# Patient Record
Sex: Male | Born: 1990
Health system: Southern US, Community
[De-identification: ages and names within clinical notes are randomized; demographics above are authoritative.]

## PROBLEM LIST (undated history)

## (undated) DIAGNOSIS — F419 Anxiety disorder, unspecified: Secondary | ICD-10-CM

## (undated) DIAGNOSIS — G43909 Migraine, unspecified, not intractable, without status migrainosus: Secondary | ICD-10-CM

## (undated) DIAGNOSIS — I10 Essential (primary) hypertension: Secondary | ICD-10-CM

## (undated) DIAGNOSIS — F329 Major depressive disorder, single episode, unspecified: Secondary | ICD-10-CM

## (undated) DIAGNOSIS — F32A Depression, unspecified: Secondary | ICD-10-CM

## (undated) HISTORY — DX: Major depressive disorder, single episode, unspecified: F32.9

## (undated) HISTORY — DX: Depression, unspecified: F32.A

## (undated) HISTORY — PX: APPENDECTOMY: SHX54

## (undated) HISTORY — DX: Anxiety disorder, unspecified: F41.9

---

## 2013-02-18 ENCOUNTER — Emergency Department (HOSPITAL_COMMUNITY)
Admission: EM | Admit: 2013-02-18 | Discharge: 2013-02-18 | Disposition: A | Discharge status: Home / Self Care | Payer: BC Managed Care – PPO | Attending: Emergency Medicine | Admitting: Emergency Medicine

## 2013-02-18 ENCOUNTER — Encounter (HOSPITAL_COMMUNITY): Payer: Self-pay | Admitting: *Deleted

## 2013-02-18 DIAGNOSIS — R109 Unspecified abdominal pain: Secondary | ICD-10-CM | POA: Insufficient documentation

## 2013-02-18 DIAGNOSIS — M545 Low back pain, unspecified: Secondary | ICD-10-CM | POA: Insufficient documentation

## 2013-02-18 DIAGNOSIS — Z8679 Personal history of other diseases of the circulatory system: Secondary | ICD-10-CM | POA: Insufficient documentation

## 2013-02-18 HISTORY — DX: Migraine, unspecified, not intractable, without status migrainosus: G43.909

## 2013-02-18 LAB — CBC WITH DIFFERENTIAL/PLATELET
Basophils Absolute: 0 10*3/uL (ref 0.0–0.1)
Basophils Relative: 0 % (ref 0–1)
Eosinophils Absolute: 0.1 10*3/uL (ref 0.0–0.7)
Eosinophils Relative: 1 % (ref 0–5)
HCT: 41.9 % (ref 39.0–52.0)
Hemoglobin: 15.1 g/dL (ref 13.0–17.0)
Lymphocytes Relative: 36 % (ref 12–46)
Lymphs Abs: 1.4 10*3/uL (ref 0.7–4.0)
MCH: 28.3 pg (ref 26.0–34.0)
MCHC: 36 g/dL (ref 30.0–36.0)
MCV: 78.5 fL (ref 78.0–100.0)
Monocytes Absolute: 0.3 10*3/uL (ref 0.1–1.0)
Monocytes Relative: 9 % (ref 3–12)
Neutro Abs: 2 10*3/uL (ref 1.7–7.7)
Neutrophils Relative %: 54 % (ref 43–77)
Platelets: 226 10*3/uL (ref 150–400)
RBC: 5.34 MIL/uL (ref 4.22–5.81)
RDW: 14.2 % (ref 11.5–15.5)
WBC: 3.8 10*3/uL — ABNORMAL LOW (ref 4.0–10.5)

## 2013-02-18 LAB — BASIC METABOLIC PANEL
BUN: 15 mg/dL (ref 6–23)
CO2: 25 mEq/L (ref 19–32)
Calcium: 9.6 mg/dL (ref 8.4–10.5)
Chloride: 104 mEq/L (ref 96–112)
Creatinine, Ser: 1.29 mg/dL (ref 0.50–1.35)
GFR calc Af Amer: 89 mL/min — ABNORMAL LOW (ref >90)
GFR calc non Af Amer: 76 mL/min — ABNORMAL LOW (ref >90)
Glucose, Bld: 99 mg/dL (ref 70–99)
Potassium: 4.1 mEq/L (ref 3.5–5.1)
Sodium: 137 mEq/L (ref 135–145)

## 2013-02-18 LAB — URINALYSIS, ROUTINE W REFLEX MICROSCOPIC
Bilirubin Urine: NEGATIVE
Glucose, UA: NEGATIVE mg/dL
Hgb urine dipstick: NEGATIVE
Ketones, ur: NEGATIVE mg/dL
Leukocytes, UA: NEGATIVE
Nitrite: NEGATIVE
Protein, ur: NEGATIVE mg/dL
Specific Gravity, Urine: 1.023 (ref 1.005–1.030)
Urobilinogen, UA: 0.2 mg/dL (ref 0.0–1.0)
pH: 6.5 (ref 5.0–8.0)

## 2013-02-18 MED ORDER — PROMETHAZINE HCL 25 MG PO TABS: 25.0000 mg | ORAL_TABLET | Freq: Four times a day (QID) | ORAL | Status: DC | PRN

## 2013-02-18 MED ORDER — OXYCODONE-ACETAMINOPHEN 5-325 MG PO TABS: 1.0000 | ORAL_TABLET | ORAL | Status: DC | PRN

## 2013-02-18 NOTE — ED Notes (Signed)
Patient is alert and orientedx4.  Patient was explained discharge instructions and they understood them with no questions.   

## 2013-02-18 NOTE — ED Notes (Signed)
Patient said his back pain started last Thursday after moving some equipment.  The patient said his right side from his head to his hip feels abnormal, totally separate from his left side. The patient said he has had a migraine headache for two months.  His right side has been clammy, but his throat feels dry.  The patient said he came in today because his pain got worse.  He has been taking ibuprofen and naproxen at home.

## 2013-02-18 NOTE — ED Provider Notes (Signed)
History     CSN: [id number]  Arrival date & time 02/18/13  0818   First MD Initiated Contact with Patient 02/18/13 [phone]      Chief Complaint  Patient presents with  . Back Pain  . Flank Pain    (Consider location/radiation/quality/duration/timing/severity/associated sxs/prior treatment) HPI... sharp lower back pain for one week.  No dysuria,hematuria, frequency, fever, chills, chest pain, dyspnea. Nothing makes symptoms better or worse. Severity is mild to moderate.patient is normally healthy  Past Medical History  Diagnosis Date  . Migraine     History reviewed. No pertinent past surgical history.  History reviewed. No pertinent family history.  History  Substance Use Topics  . Smoking status: Not on file  . Smokeless tobacco: Not on file  . Alcohol Use: Not on file     Comment: occ      Review of Systems  All other systems reviewed and are negative.    Allergies  Review of patient's allergies indicates no known allergies.  Home Medications   Current Outpatient Rx  Name  Route  Sig  Dispense  Refill  . ibuprofen (ADVIL,MOTRIN) 600 MG tablet   Oral   Take 600 mg by mouth every 6 (six) hours as needed for pain.         . naproxen sodium (ANAPROX) 220 MG tablet   Oral   Take 440 mg by mouth 2 (two) times daily as needed (for pain).         Marland Kitchen oxyCODONE-acetaminophen (PERCOCET) 5-325 MG per tablet   Oral   Take 1 tablet by mouth every 4 (four) hours as needed for pain.   20 tablet   0   . promethazine (PHENERGAN) 25 MG tablet   Oral   Take 1 tablet (25 mg total) by mouth every 6 (six) hours as needed for nausea.   15 tablet   0     BP 139/60  Pulse 77  Temp(Src) 98.1 F (36.7 C) (Oral)  Resp 18  SpO2 100%  Physical Exam  Nursing note and vitals reviewed. Constitutional: He is oriented to person, place, and time. He appears well-developed and well-nourished.  HENT:  Head: Normocephalic and atraumatic.  Eyes: Conjunctivae and EOM are  normal. Pupils are equal, round, and reactive to light.  Neck: Normal range of motion. Neck supple.  Cardiovascular: Normal rate, regular rhythm and normal heart sounds.   Pulmonary/Chest: Effort normal and breath sounds normal.  Abdominal: Soft. Bowel sounds are normal.  Genitourinary:  Minimal right flank tenderness  Musculoskeletal: Normal range of motion.  Neurological: He is alert and oriented to person, place, and time.  Skin: Skin is warm and dry.  Psychiatric: He has a normal mood and affect.    ED Course  Procedures (including critical care time)  Labs Reviewed  BASIC METABOLIC PANEL - Abnormal; Notable for the following:    GFR calc non Af Amer 76 (*)    GFR calc Af Amer 89 (*)    All other components within normal limits  CBC WITH DIFFERENTIAL - Abnormal; Notable for the following:    WBC 3.8 (*)    All other components within normal limits  URINALYSIS, ROUTINE W REFLEX MICROSCOPIC   No results found.   1. Right flank pain       MDM  Patient has normal physical exam including normal vital signs.  Screening labs and urinalysis normal.  Discharge meds Percocet #20 and Phenergan 25 mg #15  Donnetta Hutching, MD 02/18/13 1316

## 2013-02-18 NOTE — ED Notes (Signed)
Pt reports lower back pain x 1 week. Started having right side flank pain/rib pain last night. No acute distress noted at triage.

## 2015-02-15 ENCOUNTER — Ambulatory Visit
Admission: RE | Admit: 2015-02-15 | Discharge: 2015-02-15 | Disposition: A | Discharge status: Home / Self Care | Payer: Medicaid Other | Source: Ambulatory Visit | Attending: Family Medicine | Admitting: Family Medicine

## 2015-02-15 ENCOUNTER — Other Ambulatory Visit: Payer: Self-pay | Admitting: Family Medicine

## 2015-02-15 DIAGNOSIS — R52 Pain, unspecified: Secondary | ICD-10-CM

## 2015-11-14 ENCOUNTER — Telehealth: Payer: Self-pay | Admitting: Internal Medicine

## 2015-11-14 NOTE — Telephone Encounter (Signed)
NO ANSWER/ LMTCB IF HE NEEDS TO CANCEL APPT.

## 2015-11-14 NOTE — Telephone Encounter (Signed)
CALLED BACK AND CONFIRMED APPT FOR 11/15/15

## 2015-11-15 ENCOUNTER — Ambulatory Visit: Payer: Medicaid Other | Admitting: Internal Medicine

## 2015-11-17 ENCOUNTER — Encounter: Payer: Self-pay | Admitting: Internal Medicine

## 2015-11-17 ENCOUNTER — Ambulatory Visit (HOSPITAL_COMMUNITY)
Admission: RE | Admit: 2015-11-17 | Discharge: 2015-11-17 | Disposition: A | Discharge status: Home / Self Care | Payer: Medicaid Other | Source: Ambulatory Visit | Attending: Oncology | Admitting: Oncology

## 2015-11-17 ENCOUNTER — Ambulatory Visit (INDEPENDENT_AMBULATORY_CARE_PROVIDER_SITE_OTHER): Payer: Medicaid Other | Admitting: Internal Medicine

## 2015-11-17 VITALS — BP 104/61 | HR 61 | Temp 98.5°F | Ht 70.9 in | Wt 225.7 lb

## 2015-11-17 DIAGNOSIS — R51 Headache: Secondary | ICD-10-CM | POA: Diagnosis not present

## 2015-11-17 DIAGNOSIS — M25511 Pain in right shoulder: Secondary | ICD-10-CM | POA: Insufficient documentation

## 2015-11-17 DIAGNOSIS — Z23 Encounter for immunization: Secondary | ICD-10-CM

## 2015-11-17 DIAGNOSIS — F418 Other specified anxiety disorders: Secondary | ICD-10-CM

## 2015-11-17 DIAGNOSIS — G44229 Chronic tension-type headache, not intractable: Secondary | ICD-10-CM

## 2015-11-17 DIAGNOSIS — R519 Headache, unspecified: Secondary | ICD-10-CM | POA: Insufficient documentation

## 2015-11-17 DIAGNOSIS — F319 Bipolar disorder, unspecified: Secondary | ICD-10-CM | POA: Insufficient documentation

## 2015-11-17 DIAGNOSIS — Z0000 Encounter for general adult medical examination without abnormal findings: Secondary | ICD-10-CM | POA: Insufficient documentation

## 2015-11-17 MED ORDER — MELOXICAM 15 MG PO TABS: 15.0000 mg | ORAL_TABLET | Freq: Every day | ORAL | Status: DC

## 2015-11-17 MED ORDER — MELOXICAM 15 MG PO TABS: 7.5000 mg | ORAL_TABLET | Freq: Every day | ORAL | Status: DC

## 2015-11-17 NOTE — Progress Notes (Signed)
   Subjective:   Patient ID: Paul Jarvis male   DOB: January 04, 1991 25 y.o.   MRN: [id number]  HPI: Paul Jarvis is a 25 y.o. male w/ no significant PMHx, presents to the clinic today for a new patient visit for right sided headache. Patient states he has had chronic headaches for as long as he can remember. States the pain starts in his right temple and extends into his neck and down his right arm and feels like muscle cramping. He states the pain can last for days at a time and is typically dull in nature, 10/10 in severity. He has tried Tylenol but this does not seem to help. He feels that he sometimes has light and sound sensitivity, but denies nausea or vomiting. No positional changes. No fever or chills. He does feel that sometimes his vision is slightly blurry in his right eye during his acute episodes, but does not describe sensation of aura. No nuchal rigidity. No eye watering. The patient does describe issues with stress, anxiety, and depression, for which he is seeing a psychologist relatively regularly.   Patient also describes loud shoulder popping in his right shoulder with extension/abduction but denies significant pain.   No significant family history, former smoker, no alcohol or drug use. Married, wife accompanied patient to appointment today, have small child.    Past Medical History  Diagnosis Date  . Migraine   . Anxiety   . Depression    Current Outpatient Prescriptions  Medication Sig Dispense Refill  . meloxicam (MOBIC) 15 MG tablet Take 1 tablet (15 mg total) by mouth daily. 30 tablet 1   No current facility-administered medications for this visit.    Review of Systems: General: Denies fever, chills, diaphoresis, appetite change and fatigue.  Respiratory: Denies SOB, DOE, cough, and wheezing.   Cardiovascular: Denies chest pain and palpitations.  Gastrointestinal: Denies nausea, vomiting, abdominal pain, and diarrhea.  Genitourinary: Denies dysuria, increased  frequency, and flank pain. Endocrine: Denies hot or cold intolerance, polyuria, and polydipsia. Musculoskeletal: Denies myalgias, back pain, joint swelling, arthralgias and gait problem.  Skin: Denies pallor, rash and wounds.  Neurological: Positive for headaches. Denies dizziness, seizures, syncope, weakness, lightheadedness, numbness and headaches.  Psychiatric/Behavioral: Denies mood changes, and sleep disturbances.  Objective:   Physical Exam: Filed Vitals:   11/17/15 1540  BP: 104/61  Pulse: 61  Temp: 98.5 F (36.9 C)  TempSrc: Oral  Height: 5' 10.9" (1.801 m)  Weight: 225 lb 11.2 oz (102.377 kg)  SpO2: 100%    General: Alert, cooperative, NAD. HEENT: PERRL, EOMI. Moist mucus membranes Neck: Full range of motion without pain, supple, no lymphadenopathy or carotid bruits Lungs: Clear to ascultation bilaterally, normal work of respiration, no wheezes, rales, rhonchi Heart: RRR, no murmurs, gallops, or rubs Abdomen: Soft, non-tender, non-distended, BS + Extremities: No cyanosis, clubbing, or edema. No limitations in ROM in shoulder joint, no tenderness to palpation.  Neurologic: Alert & oriented X3, cranial nerves II-XII intact, strength grossly intact, sensation intact to light touch   Assessment & Plan:   Please see problem based assessment and plan.

## 2015-11-17 NOTE — Patient Instructions (Signed)
1. Make a follow up appointment for 4 weeks.   2. Please take all medications as previously prescribed with the following changes:  Take Mobic 15 mg once daily AS NEEDED for headache.   Go upstairs to radiology for right shoulder XR.  I will call you only if there are any abnormalities with labs and imaging.   3. If you have worsening of your symptoms or new symptoms arise, please call the clinic ([phone]), or go to the ER immediately if symptoms are severe.   Tension Headache A tension headache is a feeling of pain, pressure, or aching that is often felt over the front and sides of the head. The pain can be dull, or it can feel tight (constricting). Tension headaches are not normally associated with nausea or vomiting, and they do not get worse with physical activity. Tension headaches can last from 30 minutes to several days. This is the most common type of headache. CAUSES The exact cause of this condition is not known. Tension headaches often begin after stress, anxiety, or depression. Other triggers may include:  Alcohol.  Too much caffeine, or caffeine withdrawal.  Respiratory infections, such as colds, flu, or sinus infections.  Dental problems or teeth clenching.  Fatigue.  Holding your head and neck in the same position for a long period of time, such as while using a computer.  Smoking. SYMPTOMS Symptoms of this condition include:  A feeling of pressure around the head.  Dull, aching head pain.  Pain felt over the front and sides of the head.  Tenderness in the muscles of the head, neck, and shoulders. DIAGNOSIS This condition may be diagnosed based on your symptoms and a physical exam. Tests may be done, such as a CT scan or an MRI of your head. These tests may be done if your symptoms are severe or unusual. TREATMENT This condition may be treated with lifestyle changes and medicines to help relieve symptoms. HOME CARE INSTRUCTIONS Managing Pain  Take  over-the-counter and prescription medicines only as told by your health care provider.  Lie down in a dark, quiet room when you have a headache.  If directed, apply ice to the head and neck area:  Put ice in a plastic bag.  Place a towel between your skin and the bag.  Leave the ice on for 20 minutes, 2-3 times per day.  Use a heating pad or a hot shower to apply heat to the head and neck area as told by your health care provider. Eating and Drinking  Eat meals on a regular schedule.  Limit alcohol use.  Decrease your caffeine intake, or stop using caffeine. General Instructions  Keep all follow-up visits as told by your health care provider. This is important.  Keep a headache journal to help find out what may trigger your headaches. For example, write down:  What you eat and drink.  How much sleep you get.  Any change to your diet or medicines.  Try massage or other relaxation techniques.  Limit stress.  Sit up straight, and avoid tensing your muscles.  Do not use tobacco products, including cigarettes, chewing tobacco, or e-cigarettes. If you need help quitting, ask your health care provider.  Exercise regularly as told by your health care provider.  Get 7-9 hours of sleep, or the amount recommended by your health care provider. SEEK MEDICAL CARE IF:  Your symptoms are not helped by medicine.  You have a headache that is different from what you normally  experience.  You have nausea or you vomit.  You have a fever. SEEK IMMEDIATE MEDICAL CARE IF:  Your headache becomes severe.  You have repeated vomiting.  You have a stiff neck.  You have a loss of vision.  You have problems with speech.  You have pain in your eye or ear.  You have muscular weakness or loss of muscle control.  You lose your balance or you have trouble walking.  You feel faint or you pass out.  You have confusion.   This information is not intended to replace advice given to  you by your health care provider. Make sure you discuss any questions you have with your health care provider.   Document Released: 10/01/2005 Document Revised: 06/22/2015 Document Reviewed: 01/24/2015 Elsevier Interactive Patient Education [employer]

## 2015-11-18 ENCOUNTER — Encounter: Payer: Self-pay | Admitting: Internal Medicine

## 2015-11-18 LAB — CBC WITH DIFFERENTIAL/PLATELET
Basophils Absolute: 0 10*3/uL (ref 0.0–0.2)
Basos: 0 %
EOS (ABSOLUTE): 0.1 10*3/uL (ref 0.0–0.4)
Eos: 3 %
Hematocrit: 42.7 % (ref 37.5–51.0)
Hemoglobin: 14.8 g/dL (ref 12.6–17.7)
Immature Grans (Abs): 0 10*3/uL (ref 0.0–0.1)
Immature Granulocytes: 0 %
Lymphocytes Absolute: 2.3 10*3/uL (ref 0.7–3.1)
Lymphs: 46 %
MCH: 27.4 pg (ref 26.6–33.0)
MCHC: 34.7 g/dL (ref 31.5–35.7)
MCV: 79 fL (ref 79–97)
Monocytes Absolute: 0.4 10*3/uL (ref 0.1–0.9)
Monocytes: 8 %
Neutrophils Absolute: 2.2 10*3/uL (ref 1.4–7.0)
Neutrophils: 43 %
Platelets: 269 10*3/uL (ref 150–379)
RBC: 5.4 x10E6/uL (ref 4.14–5.80)
RDW: 14.3 % (ref 12.3–15.4)
WBC: 5.1 10*3/uL (ref 3.4–10.8)

## 2015-11-18 LAB — CMP14 + ANION GAP
ALT: 18 IU/L (ref 0–44)
AST: 16 IU/L (ref 0–40)
Albumin/Globulin Ratio: 1.8 (ref 1.1–2.5)
Albumin: 4.5 g/dL (ref 3.5–5.5)
Alkaline Phosphatase: 58 IU/L (ref 39–117)
Anion Gap: 18 mmol/L (ref 10.0–18.0)
BUN/Creatinine Ratio: 11 (ref 8–19)
BUN: 14 mg/dL (ref 6–20)
Bilirubin Total: 0.3 mg/dL (ref 0.0–1.2)
CO2: 20 mmol/L (ref 18–29)
Calcium: 9.4 mg/dL (ref 8.7–10.2)
Chloride: 102 mmol/L (ref 96–106)
Creatinine, Ser: 1.25 mg/dL (ref 0.76–1.27)
GFR calc Af Amer: 91 mL/min/{1.73_m2} (ref >59)
GFR calc non Af Amer: 79 mL/min/{1.73_m2} (ref >59)
Globulin, Total: 2.5 g/dL (ref 1.5–4.5)
Glucose: 97 mg/dL (ref 65–99)
Potassium: 4.1 mmol/L (ref 3.5–5.2)
Sodium: 140 mmol/L (ref 134–144)
Total Protein: 7 g/dL (ref 6.0–8.5)

## 2015-11-18 LAB — HIV ANTIBODY (ROUTINE TESTING W REFLEX): HIV Screen 4th Generation wRfx: NONREACTIVE

## 2015-11-18 NOTE — Assessment & Plan Note (Signed)
Checked basic labs today, no significant abnormalities. HIV negative. Given Tdap.

## 2015-11-18 NOTE — Assessment & Plan Note (Signed)
Has some very mild right shoulder pain, but mostly he has loud shoulder popping with abduction and rotation. No apparent trauma or injury. XR of the shoulder performed, showed no significant bony abnormalities. If this starts to cause him more significant discomfort, can consider referral to sports medicine and physical therapy.

## 2015-11-18 NOTE — Assessment & Plan Note (Addendum)
Patient describes a dull, right sided headache that extends into his neck and shoulder. Has issues with stress and anxiety, as well as mild depression. Sees psychologist for this. Has taken Tylenol for his headache but with no relief. More than anything, he seems to describe symptoms of a tension headache, but he seems to have some features of both migraine and cluster headaches as well. Does not have nausea, no aura, minimal light or sound sensitivity. Headache is unilateral and sometimes has mild decreased vision in his right eye during an episode, but does not have eye watering, redness, or sharp stabbing pain behind the eye. Has apparently been told he has a migraine in the past.  -Will start with NSAIDS; interested in trying Mobic 15 mg daily prn given that this is a once daily medication (does not like to take a lot of medications). Has tried some OTC NSAIDS with limited relief.  -If no benefit with this, can switch to different NSAID.  -Discussed controlling anxiety and stress. -RTC in 4 weeks

## 2015-11-18 NOTE — Assessment & Plan Note (Signed)
Describes features of mild depression with anxiety, sees psychologist regularly for this. Is not interested in trying medications. Does not have suicidal ideations.

## 2015-11-18 NOTE — Progress Notes (Signed)
[facility] Medicine Clinic Attending  Case discussed with Dr. Jones at the time of the visit.  We reviewed the resident's history and exam and pertinent patient test results.  I agree with the assessment, diagnosis, and plan of care documented in the resident's note.  

## 2015-12-20 ENCOUNTER — Ambulatory Visit (INDEPENDENT_AMBULATORY_CARE_PROVIDER_SITE_OTHER): Payer: Medicaid Other | Admitting: Internal Medicine

## 2015-12-20 ENCOUNTER — Encounter: Payer: Self-pay | Admitting: Internal Medicine

## 2015-12-20 VITALS — BP 138/58 | HR 66 | Temp 97.9°F | Ht 69.9 in | Wt 232.3 lb

## 2015-12-20 DIAGNOSIS — G44219 Episodic tension-type headache, not intractable: Secondary | ICD-10-CM

## 2015-12-20 DIAGNOSIS — R51 Headache: Secondary | ICD-10-CM | POA: Diagnosis not present

## 2015-12-20 DIAGNOSIS — M25511 Pain in right shoulder: Secondary | ICD-10-CM

## 2015-12-20 NOTE — Progress Notes (Signed)
   Subjective:   Patient ID: Paul Jarvis Paul Jarvis male   DOB: Jun 23, 1991 25 y.o.   MRN: [id number]  HPI: [name] is a 25 y.o. male w/ no significant PMHx, presents to the clinic today for a follow up visit for shoulder pain and headaches. Says the headaches have improved with the use of NSAIDS. Is complaining of continued right shoulder discomfort. XR at last visit with no acute abnormalities. 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 No difficulty with ROM, but says it aches on the top of his shoulder.    Current Outpatient Prescriptions  Medication Sig Dispense Refill  . meloxicam (MOBIC) 15 MG tablet Take 1 tablet (15 mg total) by mouth daily. 30 tablet 1   No current facility-administered medications for this visit.    Review of Systems  General: Denies fever, diaphoresis, appetite change, and fatigue.  Respiratory: Denies SOB, cough, and wheezing.   Cardiovascular: Denies chest pain and palpitations.  Gastrointestinal: Denies nausea, vomiting, abdominal pain, and diarrhea Musculoskeletal: Positive for right shoulder pain. Denies myalgias, back pain, and gait problem.  Neurological: Denies dizziness, syncope, weakness, lightheadedness, and headaches.  Psychiatric/Behavioral: Denies mood changes, sleep disturbance, and agitation.   Objective:   Physical Exam: Filed Vitals:   12/20/15 1602  BP: 138/58  Pulse: 66  Temp: 97.9 F (36.6 C)  TempSrc: Oral  Height: 5' 9.9" (1.775 m)  Weight: 232 lb 4.8 oz (105.371 kg)  SpO2: 100%    General: Alert, cooperative, NAD. HEENT: PERRL, EOMI. Moist mucus membranes Neck: Full range of motion without pain, supple, no lymphadenopathy or carotid bruits Lungs: Clear to ascultation bilaterally, normal work of respiration, no wheezes, rales, rhonchi Heart: RRR, no murmurs, gallops, or rubs Abdomen: Soft, non-tender, non-distended, BS + Extremities: No cyanosis, clubbing, or edema. No limitations in ROM in right  shoulder joint. Mild tenderness in the location of the subacromial space. No increased laxity. Neurologic: Alert & oriented X3, cranial nerves II-XII intact, strength grossly intact, sensation intact to light touch   Assessment & Plan:   Please see problem based assessment and plan.

## 2015-12-20 NOTE — Patient Instructions (Signed)
1. Please make a follow up appointment in 6 months.   2. Someone from PT and Sports Medicine will contact you.   3. If you have worsening of your symptoms or new symptoms arise, please call the clinic [phone]), or go to the ER immediately if symptoms are severe.  Shoulder Pain The shoulder is the joint that connects your arms to your body. The bones that form the shoulder joint include the upper arm bone (humerus), the shoulder blade (scapula), and the collarbone (clavicle). The top of the humerus is shaped like a ball and fits into a rather flat socket on the scapula (glenoid cavity). A combination of muscles and strong, fibrous tissues that connect muscles to bones (tendons) support your shoulder joint and hold the ball in the socket. Small, fluid-filled sacs (bursae) are located in different areas of the joint. They act as cushions between the bones and the overlying soft tissues and help reduce friction between the gliding tendons and the bone as you move your arm. Your shoulder joint allows a wide range of motion in your arm. This range of motion allows you to do things like scratch your back or throw a ball. However, this range of motion also makes your shoulder more prone to pain from overuse and injury. Causes of shoulder pain can originate from both injury and overuse and usually can be grouped in the following four categories:  Redness, swelling, and pain (inflammation) of the tendon (tendinitis) or the bursae (bursitis).  Instability, such as a dislocation of the joint.  Inflammation of the joint (arthritis).  Broken bone (fracture). HOME CARE INSTRUCTIONS   Apply ice to the sore area.  Put ice in a plastic bag.  Place a towel between your skin and the bag.  Leave the ice on for 15-20 minutes, 3-4 times per day for the first 2 days, or as directed by your health care provider.  Stop using cold packs if they do not help with the pain.  If you have a shoulder sling or  immobilizer, wear it as long as your caregiver instructs. Only remove it to shower or bathe. Move your arm as little as possible, but keep your hand moving to prevent swelling.  Squeeze a soft ball or foam pad as much as possible to help prevent swelling.  Only take over-the-counter or prescription medicines for pain, discomfort, or fever as directed by your caregiver. SEEK MEDICAL CARE IF:   Your shoulder pain increases, or new pain develops in your arm, hand, or fingers.  Your hand or fingers become cold and numb.  Your pain is not relieved with medicines. SEEK IMMEDIATE MEDICAL CARE IF:   Your arm, hand, or fingers are numb or tingling.  Your arm, hand, or fingers are significantly swollen or turn white or blue. MAKE SURE YOU:   Understand these instructions.  Will watch your condition.  Will get help right away if you are not doing well or get worse.   This information is not intended to replace advice given to you by your health care provider. Make sure you discuss any questions you have with your health care provider.   Document Released: 07/11/2005 Document Revised: 10/22/2014 Document Reviewed: 01/24/2015 Elsevier Interactive Patient Education [employer]

## 2015-12-21 NOTE — Assessment & Plan Note (Signed)
Still suspect this is mostly tension type headaches given extension into his neck, no significant photophobia or phonophobia, no nausea or vomiting. Episodes do not interfere with his daily life. Improved with NSAIDS. Has been taking Mobic for shoulder pain and headache and says this has helped.  -Continue NSAIDS for intermittent headaches.

## 2015-12-21 NOTE — Assessment & Plan Note (Signed)
Patient still complaining of right shoulder discomfort. No limitations with ROM, no pain with active or passive ROM. Does have some mild tenderness to palpation over the subacromial space. 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 Recent XR negative for acute abnormality. Possibility of subacromial bursitis given his tenderness vs adhesive capsulitis (less likely). Do not suspect rotator cuff tear given his exam.  -Referral to PT, sports medicine -Continue Mobic prn

## 2015-12-23 NOTE — Progress Notes (Signed)
[facility] Medicine Clinic Attending  Case discussed with Dr. Jones at the time of the visit.  We reviewed the resident's history and exam and pertinent patient test results.  I agree with the assessment, diagnosis, and plan of care documented in the resident's note.  

## 2016-01-03 ENCOUNTER — Ambulatory Visit: Payer: Medicaid Other | Admitting: Family Medicine

## 2016-01-05 ENCOUNTER — Ambulatory Visit: Payer: Medicaid Other

## 2016-01-17 ENCOUNTER — Ambulatory Visit: Payer: Medicaid Other | Admitting: Family Medicine

## 2016-01-17 NOTE — Addendum Note (Signed)
Addended by: Neomia DearPOWERS, Lancelot Alyea E on: 01/17/2016 06:36 PM   Modules accepted: Orders

## 2016-01-19 ENCOUNTER — Ambulatory Visit: Payer: Medicaid Other

## 2016-01-31 ENCOUNTER — Ambulatory Visit: Payer: Medicaid Other | Attending: Internal Medicine | Admitting: Physical Therapy

## 2016-01-31 ENCOUNTER — Encounter: Payer: Self-pay | Admitting: Physical Therapy

## 2016-01-31 DIAGNOSIS — M542 Cervicalgia: Secondary | ICD-10-CM | POA: Diagnosis present

## 2016-01-31 DIAGNOSIS — M25511 Pain in right shoulder: Secondary | ICD-10-CM | POA: Insufficient documentation

## 2016-01-31 NOTE — Therapy (Signed)
[facility] [address] [place], [place], [address] Phone: [phone]   Fax:  [phone]  Physical Therapy Evaluation  Patient Details  Name: Fredric DineLamar Cunnington MRN: [id number] Date of Birth: 1991/08/27 Referring Provider: Courtney ParisEden W Jones  Encounter Date: 01/31/2016      PT End of Session - 01/31/16 1035    Visit Number 1   Number of Visits 1   Authorization Type medicaid one-time evaluation   PT Start Time 0935   PT Stop Time 1030   PT Time Calculation (min) 55 min   Activity Tolerance Patient tolerated treatment well;Patient limited by fatigue   Behavior [facility] for tasks assessed/performed      Past Medical History  Diagnosis Date  . Migraine   . Anxiety   . Depression     History reviewed. No pertinent past surgical history.  There were no vitals filed for this visit.       Subjective Assessment - 01/31/16 1030    Subjective Patient reports feeling that his arm "is just there" [facility]. Began a few years ago but has recently gotten worse to where he is experiencing neck stiffness and sharp pains into hand with gripping and lifting.    Limitations Lifting;House hold activities   Currently in Pain? Yes   Pain Score 3    Pain Location Shoulder   Pain Orientation Right   Pain Descriptors / Indicators Aching;Discomfort   Pain Type Chronic pain   Pain Onset More than a month ago   Pain Frequency Constant   Aggravating Factors  playing drums   Pain Relieving Factors rest            [facility] PT Assessment - 01/31/16 0001    Assessment   Medical Diagnosis R shoulder pain   Referring Provider Courtney ParisEden W Jones   Onset Date/Surgical Date --  no surgical intervention   Hand Dominance Right   Next MD Visit --  not scheduled at this time   Prior Therapy no   Precautions   Precautions None   Restrictions   Weight Bearing Restrictions No   Balance Screen   Has the patient fallen  in the past 6 months No   Home Environment   Living Environment Private residence   Living Arrangements Spouse/significant other;Children   Prior Function   Level of Independence Independent                           PT Education - 01/31/16 1035    Education provided Yes   Education Details plan of care, HEP, anatomy of condition   Person(s) Educated Patient   Methods Explanation;Demonstration;Tactile cues;Verbal cues;Handout   Comprehension Verbalized understanding;Returned demonstration;Verbal cues required;Tactile cues required                    Plan - 01/31/16 1036    Clinical Impression Statement This was a one-time visit appointment. 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 Patient was educated on and appropriately demonstrated HEP to strengthen postural extensors and improve mobility through thoracic spine. patient  verbalized comfort with HEP and was instructed to contact with further questions.    Rehab Potential Good   PT Frequency One time visit   PT Home Exercise Plan see patient instructions      Patient will benefit from skilled therapeutic intervention  in order to improve the following deficits and impairments:  Impaired UE functional use, Decreased activity tolerance, Pain, Improper body mechanics, Decreased strength  Visit Diagnosis: Pain in right shoulder - Plan: PT plan of care cert/re-cert  Cervicalgia - Plan: PT plan of care cert/re-cert     Problem List Patient Active Problem List   Diagnosis Date Noted  . Headache 11/17/2015  . Right shoulder pain 11/17/2015  . Depression with anxiety 11/17/2015  . Healthcare maintenance 11/17/2015    Due to changes in the [facility] Policy for Rehab as of June 1st , 2014-- this patient does not have a qualifying diagnosis that is covered.  The patient is unable topay out of pocket  expenses at this time, there fore will not be seen for treatment.  Naomi Castrogiovanni C. Samara Stankowski PT, DPT 01/31/2016 10:45 AM   [facility] [address] [place], [place], [address] Phone: [phone]   Fax:  [phone]  Name: Saint Hank MRN: [id number] Date of Birth: [birth year]

## 2016-02-28 ENCOUNTER — Encounter: Payer: Self-pay | Admitting: Family Medicine

## 2016-02-28 ENCOUNTER — Encounter: Payer: Self-pay | Admitting: Internal Medicine

## 2016-02-28 ENCOUNTER — Ambulatory Visit (INDEPENDENT_AMBULATORY_CARE_PROVIDER_SITE_OTHER): Payer: Medicaid Other | Admitting: Family Medicine

## 2016-02-28 ENCOUNTER — Ambulatory Visit (INDEPENDENT_AMBULATORY_CARE_PROVIDER_SITE_OTHER): Payer: Medicaid Other | Admitting: Internal Medicine

## 2016-02-28 VITALS — BP 141/71 | HR 90 | Ht 69.5 in | Wt 228.0 lb

## 2016-02-28 VITALS — BP 126/64 | HR 62 | Temp 98.3°F | Ht 69.5 in | Wt 228.2 lb

## 2016-02-28 DIAGNOSIS — L819 Disorder of pigmentation, unspecified: Secondary | ICD-10-CM | POA: Diagnosis not present

## 2016-02-28 DIAGNOSIS — S4991XA Unspecified injury of right shoulder and upper arm, initial encounter: Secondary | ICD-10-CM | POA: Diagnosis not present

## 2016-02-28 DIAGNOSIS — M899 Disorder of bone, unspecified: Secondary | ICD-10-CM | POA: Diagnosis present

## 2016-02-28 DIAGNOSIS — K5909 Other constipation: Secondary | ICD-10-CM | POA: Diagnosis present

## 2016-02-28 DIAGNOSIS — K5900 Constipation, unspecified: Secondary | ICD-10-CM | POA: Insufficient documentation

## 2016-02-28 MED ORDER — MELOXICAM 15 MG PO TABS: 15.0000 mg | ORAL_TABLET | Freq: Every day | ORAL | Status: DC

## 2016-02-28 NOTE — Assessment & Plan Note (Signed)
Overview He is concerned about skin discoloration overlying his right shoulder joint which has been present since he has been having pain. There is no itching associated with this discoloration or any other raised lesions. The area affected has not increased in size or appearance in the interval following his first observation of it. He describes it as a "warm feeling" and is concerned there is something worrisome there given that the appearance is asymmetric with his left shoulder joint.  Assessment Likely benign skin discoloration.  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

## 2016-02-28 NOTE — Patient Instructions (Signed)
Constipation, Adult  HOME CARE   Eat foods with a lot of fiber in them. This includes fruits, vegetables, beans, and whole grains such as brown rice.  Avoid fatty foods and foods with a lot of sugar. This includes french fries, hamburgers, cookies, candy, and soda.  If you are not getting enough fiber from food, take products with added fiber in them (supplements).  Drink enough fluid to keep your pee (urine) clear or pale yellow.  Exercise on a regular basis, or as told by your doctor.  Go to the restroom when you feel like you need to poop. Do not hold it.  Only take medicine as told by your doctor. Do not take medicines that help you poop (laxatives) without talking to your doctor first.

## 2016-02-28 NOTE — Progress Notes (Signed)
Paul Jarvis - 25 y.o. 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 Paul Jarvis denies any trauma. Pain is most prominent when Paul Jarvis wakes up in the morning. Paul Jarvis also does have pain with prolonged driving sessions. The pain is up to a 9 out of 10. Paul Jarvis has been seen by PT for 1 session and given a exercise curriculum which she is followed intermittently due to pain. Paul Jarvis stated that they recommended Paul Jarvis focus on scapular stabilization. Paul Jarvis states Paul Jarvis is very tight in his trap and neck. Paul Jarvis denies any pain with neck range of motion. Paul Jarvis denies any weakness. The pain is an aching type pain. Paul Jarvis did take meloxicam for 2 weeks which did help significantly and then transitioned to when necessary and does not notice a day-to-day change. No specific physician bothers him. Paul Jarvis did state that several weeks ago Paul Jarvis tried doing pushups but this aggravated the injury.  ROS:     14 point review of systems negative other than that listed above in history of present illness regards to musculoskeletal issue.  HISTORY: Past Medical, Surgical, Social, and Family History Reviewed & Updated per EMR.  Pertinent Historical Findings include: Depression with anxiety, constipation.  Data review: 11/17/2015 right shoulder x-ray, 3 view unremarkable for any osseous abnormality.  OBJECTIVE: BP 141/71 mmHg  Pulse 90  Ht 5' 9.5" (1.765 m)  Wt 228 lb (103.42 kg)  BMI 33.20 kg/m2  Physical Exam  Calm, no acute distress Nonlabored breathing  Shoulder: Right Inspection reveals no abnormalities, atrophy or asymmetry. Mild tenderness over the acromioclavicular joint. ROM is full in all planes. Rotator cuff strength normal throughout. No signs of impingement with negative Neer and Hawkin's tests, empty can. Speeds and Yergason's tests normal. No labral pathology noted with negative Obrien's,  negative clunk and good stability. Mild scapular function observed. No painful arc and no drop arm sign. No apprehension sign  Imaging: [place] image of the R shoulder in both long and short axis obtained. Biceps tendon appears normal fibrillar pattern without surrounding effusion. Subscapularis appears normal without obvious tears or abnormalities. The supraspinatus tendon appears normal with no tears and a good footprint.  The infraspinatus and teres minor tendons appear normal with no tears and a good footprints. The subdeltoid/subacromial bursa is unremarkable and shows no impingement with dynamic motion. The acromioclavicular joint does appear to have narrowing compared to the contralateral side. No spurring. Capsular distention noted.   MEDICATIONS, LABS & OTHER ORDERS: Previous Medications   No medications on file   Modified Medications   Modified Medication Previous Medication   MELOXICAM (MOBIC) 15 MG TABLET meloxicam (MOBIC) 15 MG tablet      Take 1 tablet (15 mg total) by mouth daily.    Take 15 mg by mouth daily.   New Prescriptions   No medications on file   Discontinued Medications   No medications on file  No orders of the defined types were placed in this encounter.   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 Paul Jarvis did get mild improvement with meloxicam on a regular basis. We will refill this and asked him to take this daily for the next month. Paul Jarvis does have possible acromioclavicular arthropathy and we did offer him an injection. We went over his home exercise plan provided by  physical therapy and have recommended Paul Jarvis restart this and add rowing exercises at the gym. Paul Jarvis should avoid any overhead benchpress or pushups. We have discussed posture with him. Hopefully improving his scapular positioning will help the alignment of his shoulders and reduce his pain. Paul Jarvis is still having pain in one month we can perform an  acromioclavicular joint injection. Call with any questions in the interim.

## 2016-02-28 NOTE — Assessment & Plan Note (Signed)
Overview For the past 2 months, he notes change in his diet from home-cooked meals to eating fast food. During this interval, he estimates eating fruits about twice a week and vegetables once a week. He has maintained daily water intake of 32 ounces. He notes straining with bowel movements and a sense of incomplete bowel emptying which is like constipation. He is still having daily bowel movements though the frequency is decreased from twice daily before. His bowel appearance is mostly solid and small pieces. He does have a prior history of appendectomy and is wondering if this procedure may be associated with his current symptoms. He denies associated nausea, vomiting, abdominal pain, melena.  Assessment Constipation due to low fiber intake. I do not suspect he has a functional or obstructive pathology.  Plan -Rcommended increased intake of high-fiber foods, like fruits and vegetables -Offered prescription of MiraLAX and stool softener though he declined and would like to try dietary adjustment

## 2016-02-28 NOTE — Progress Notes (Signed)
Case discussed with Dr. Patel at the time of the visit.  We reviewed the resident's history and exam and pertinent patient test results.  I agree with the assessment, diagnosis, and plan of care documented in the resident's note. 

## 2016-02-28 NOTE — Progress Notes (Signed)
   Subjective:    Patient ID: Paul Jarvis, male    DOB: [birth year], 25 y.o.   MRN: [id number]  HPI Paul Jarvis is a 25 year old male with depression/anxiety, ongoing right shoulder pain who presents today for constipation. Please see assessment & plan for status of chronic medical problems.     Review of Systems  Constitutional: Negative for appetite change.  Gastrointestinal: Positive for constipation. Negative for nausea, vomiting, abdominal pain and abdominal distention.  Musculoskeletal: Positive for arthralgias. Negative for back pain and neck pain.  Skin: Positive for color change.  Psychiatric/Behavioral: The patient is nervous/anxious.        Objective:   Physical Exam  Constitutional: He is oriented to person, place, and time. He appears well-developed and well-nourished.  HENT:  Head: Normocephalic and atraumatic.  Eyes: Conjunctivae are normal. No scleral icterus.  Neck: Normal range of motion. No tracheal deviation present.  Cardiovascular: Normal rate and regular rhythm.   Pulmonary/Chest: Effort normal. No respiratory distress.  Neurological: He is alert and oriented to person, place, and time.  Skin:  2 x 3 cm area of skin hyperpigmentation overlying right shoulder area as compared to the left. No warmth or tenderness appreciated there. No lesions present overlying discoloration.          Assessment & Plan:

## 2016-03-27 ENCOUNTER — Ambulatory Visit: Payer: Medicaid Other | Admitting: Family Medicine

## 2016-05-04 ENCOUNTER — Ambulatory Visit (INDEPENDENT_AMBULATORY_CARE_PROVIDER_SITE_OTHER): Payer: Medicaid Other

## 2016-05-04 ENCOUNTER — Encounter (HOSPITAL_COMMUNITY): Payer: Self-pay | Admitting: *Deleted

## 2016-05-04 ENCOUNTER — Ambulatory Visit (HOSPITAL_COMMUNITY)
Admission: EM | Admit: 2016-05-04 | Discharge: 2016-05-04 | Disposition: A | Discharge status: Home / Self Care | Payer: Medicaid Other | Attending: Family Medicine | Admitting: Family Medicine

## 2016-05-04 DIAGNOSIS — L03012 Cellulitis of left finger: Secondary | ICD-10-CM

## 2016-05-04 DIAGNOSIS — IMO0001 Reserved for inherently not codable concepts without codable children: Secondary | ICD-10-CM

## 2016-05-04 MED ORDER — DOXYCYCLINE HYCLATE 100 MG PO CAPS: 100.0000 mg | ORAL_CAPSULE | Freq: Two times a day (BID) | ORAL | Status: DC

## 2016-05-04 NOTE — ED Provider Notes (Addendum)
CSN: [id number]     Arrival date & time 05/04/16  1223 History   First MD Initiated Contact with Patient 05/04/16 1230     Chief Complaint  Patient presents with  . Hand Pain   (Consider location/radiation/quality/duration/timing/severity/associated sxs/prior Treatment) Patient is a 25 y.o. male presenting with hand pain. The history is provided by the patient.  Hand Pain This is a new problem. The current episode started 2 days ago. The problem has been gradually worsening. Associated symptoms comments: Cuticle sts..    Past Medical History  Diagnosis Date  . Migraine   . Anxiety   . Depression    History reviewed. No pertinent past surgical history. History reviewed. No pertinent family history. Social History  Substance Use Topics  . Smoking status: Former Smoker    Quit date: 10/16/1999  . Smokeless tobacco: None  . Alcohol Use: No     Comment: occ    Review of Systems  Constitutional: Negative.   Musculoskeletal: Positive for joint swelling.  Skin: Positive for rash. Negative for wound.  All other systems reviewed and are negative.   Allergies  Review of patient's allergies indicates no known allergies.  Home Medications   Prior to Admission medications   Medication Sig Start Date End Date Taking? Authorizing Provider  doxycycline (VIBRAMYCIN) 100 MG capsule Take 1 capsule (100 mg total) by mouth 2 (two) times daily. 05/04/16   Linna HoffJames D Kindl, MD  meloxicam (MOBIC) 15 MG tablet Take 1 tablet (15 mg total) by mouth daily. 02/28/16   Guinevere ScarletBlake Williams, MD   Meds Ordered and Administered this Visit  Medications - No data to display  BP 116/82 mmHg  Pulse 60  Temp(Src) 98.1 F (36.7 C) (Oral)  Resp 16  SpO2 100% No data found.   Physical Exam  Constitutional: He is oriented to person, place, and time. He appears well-developed and well-nourished. No distress.  Musculoskeletal: He exhibits tenderness.  Neurological: He is alert and oriented to person, place,  and time.  Skin: Skin is warm and dry. There is erythema.  nonfluctuant tender sts to lat cuticle edge of lmf. No drainage, full rom, is known nail biter.  Nursing note and vitals reviewed.   ED Course  Procedures (including critical care time)  Labs Review Labs Reviewed - No data to display  Imaging Review Dg Finger Middle Left  05/04/2016  CLINICAL DATA:  Left middle finger pain and swelling, cellulitis EXAM: LEFT MIDDLE FINGER 2+V COMPARISON:  None available FINDINGS: Mild soft tissue swelling noted. No radiopaque foreign body. No acute osseous finding or malalignment. Negative for fracture. No joint abnormality. IMPRESSION: Soft tissue swelling without acute osseous finding. Electronically Signed   By: Judie PetitM.  Shick M.D.   On: 05/04/2016 13:13   X-rays reviewed and report per radiologist.   Visual Acuity Review  Right Eye Distance:   Left Eye Distance:   Bilateral Distance:    Right Eye Near:   Left Eye Near:    Bilateral Near:         MDM   1. Paronychia of third finger of left hand        Linna HoffJames D Kindl, MD 05/04/16 1803  Linna HoffJames D Kindl, MD 05/04/16 [phone]

## 2016-05-04 NOTE — Discharge Instructions (Signed)
Soak twice a day for 5 days in warm water, take all of medicine, return if any problems. °

## 2016-05-04 NOTE — ED Notes (Signed)
PT   HAS  PAIN   AND  SOME  SWELLING OF  LEFT       MIDDLE FINGER  FOR SEVERAL   DAYS        DENYS   ANY     SPECEFIC       INJURY

## 2016-05-28 ENCOUNTER — Encounter (INDEPENDENT_AMBULATORY_CARE_PROVIDER_SITE_OTHER): Payer: Self-pay

## 2016-05-28 ENCOUNTER — Ambulatory Visit (INDEPENDENT_AMBULATORY_CARE_PROVIDER_SITE_OTHER): Payer: Medicaid Other | Admitting: Internal Medicine

## 2016-05-28 VITALS — BP 136/60 | HR 55 | Temp 98.3°F | Ht 69.5 in | Wt 233.8 lb

## 2016-05-28 DIAGNOSIS — G4700 Insomnia, unspecified: Secondary | ICD-10-CM

## 2016-05-28 DIAGNOSIS — M7581 Other shoulder lesions, right shoulder: Secondary | ICD-10-CM

## 2016-05-28 DIAGNOSIS — R351 Nocturia: Secondary | ICD-10-CM

## 2016-05-28 DIAGNOSIS — G8929 Other chronic pain: Secondary | ICD-10-CM | POA: Diagnosis present

## 2016-05-28 DIAGNOSIS — F418 Other specified anxiety disorders: Secondary | ICD-10-CM | POA: Diagnosis not present

## 2016-05-28 DIAGNOSIS — M25511 Pain in right shoulder: Secondary | ICD-10-CM

## 2016-05-28 DIAGNOSIS — R1013 Epigastric pain: Secondary | ICD-10-CM | POA: Diagnosis not present

## 2016-05-28 LAB — POCT URINALYSIS DIPSTICK
Bilirubin, UA: NEGATIVE
Glucose, UA: NEGATIVE
Ketones, UA: NEGATIVE
Leukocytes, UA: NEGATIVE
Nitrite, UA: NEGATIVE
Protein, UA: NEGATIVE
Spec Grav, UA: >=1.03
Urobilinogen, UA: 0.2
pH, UA: 5.5

## 2016-05-28 LAB — POCT GLYCOSYLATED HEMOGLOBIN (HGB A1C): Hemoglobin A1C: 5.1

## 2016-05-28 LAB — GLUCOSE, CAPILLARY: Glucose-Capillary: 93 mg/dL (ref 65–99)

## 2016-05-28 MED ORDER — SERTRALINE HCL 50 MG PO TABS: 50.0000 mg | ORAL_TABLET | Freq: Every day | ORAL | 11 refills | Status: DC

## 2016-05-28 MED ORDER — OMEPRAZOLE 20 MG PO TBEC: 20.0000 mg | DELAYED_RELEASE_TABLET | Freq: Every day | ORAL | 2 refills | Status: DC

## 2016-05-28 NOTE — Assessment & Plan Note (Signed)
Patient complains of right shoulder pain which is chronic and due to acromioclavicular tendonitis (followed by Sports Medicine). Patient has not been taking his Mobic as prescribed. He is participating in PT. Last Sports Medicine note from May 2017 recommended steroid injection if pain persisted.   Plan: -Restart Mobic QD with Omeprazole 10 mg daily -Tylenol PM prn  -Follow up with Sports Medicine for steroid injection

## 2016-05-28 NOTE — Assessment & Plan Note (Signed)
Patient also admits to dyspepsia after eating for the last 2 weeks which started after completion of  Doxycycline. He admits to abdominal discomfort and nausea but denies vomiting, diarrhea, fever, chills, or blood in his stool. He denies history of PUD/Gastritis.   Likely secondary to mild gastritis from Mobic and Doxycycline.   Plan: -Start Omeprazole 20 mg QD -Stop Mobic if pain persists

## 2016-05-28 NOTE — Progress Notes (Signed)
    CC: insomnia  HPI: [name] is a 25 y.o. male with PMHx of Depression who presents to the clinic for complaint of insomnia  Patient complains of fatigue and difficulty staying asleep for the past 2 years. Patient normally goes to bed around midnight but awakes several times during the night due to right shoulder pain which is chronic and due to acromioclavicular tendonitis (follwed by Sports Medicine) and due to polyuria. Patient has not been taking his Mobic as prescribed. He is participating in PT. Last Sports Medicine note from May 2017 recommended steroid injection if pain persisted. Patient complains of polyuria. He wakes up to urinate 4 times during the night. He admits to drinking liquids close to bedtime. He denies polydipsia. He admits to a family history of T2DM. UA shows no signs of infection or glucose, but does have trace RBCs.   Patient also admits to depression for which is currently seeing a counselor, but has not been starting on antidepressants due to patient's choice.   Patient also admits to dyspepsia after eating for the last 2 weeks which started after completion of  Doxycycline. He admits to abdominal discomfort and nausea but denies vomiting, diarrhea, fever, chills, or blood in his stool. He denies history of PUD/Gastritis.   Past Medical History:  Diagnosis Date  . Anxiety   . Depression   . Migraine    Review of Systems: A complete ROS was negative except as noted in HPI.   Physical Exam: Vitals:   05/28/16 1450  BP: 136/60  Pulse: (!) 55  Temp: 98.3 F (36.8 C)  TempSrc: Oral  SpO2: 100%  Weight: 233 lb 12.8 oz (106.1 kg)  Height: 5' 9.5" (1.765 m)   General: Vital signs reviewed.  Patient is well-developed and well-nourished, in no acute distress and cooperative with exam.  Cardiovascular: RRR, S1 normal, S2 normal, no murmurs, gallops, or rubs. Pulmonary/Chest: Clear to auscultation bilaterally, no wheezes, rales, or rhonchi. Abdominal:  Soft, non-tender, non-distended, BS +, no masses, organomegaly, or guarding present.  Extremities: No lower extremity edema bilaterally Skin: Warm, dry and intact. No rashes or erythema. Psychiatric: Normal mood and affect. speech and behavior is normal. Cognition and memory are grossly normal.   Assessment & Plan:  See encounters tab for problem based medical decision making. Patient discussed with Dr. Criselda PeachesMullen

## 2016-05-28 NOTE — Assessment & Plan Note (Signed)
Patient complains of polyuria and nocturia. He wakes up to urinate 4 times during the night. He admits to drinking liquids close to bedtime. He denies polydipsia. He admits to a family history of T2DM. UA shows no signs of infection or glucose, but does have trace RBCs.   Nocturia is likely secondary to increased liquids at bedtime. A1c was 5.7; therefore, he does not have polyuria due to hyperglycemia. Other etiologies include prostate cancer.   At follow up visit, would discuss family history of prostate cancer and discussion of screening. Will need a repeat UA to ensure clearance of RBCs.

## 2016-05-28 NOTE — Patient Instructions (Signed)
FOR YOUR SHOULDER PAIN, PLEASE FOLLOW UP WITH SPORTS MEDICINE FOR A STEROID INJECTION. CONTINUE TAKING YOUR MELOXICAM ONCE A DAY.  FOR YOUR STOMACH, USE OMEPRAZOLE 20 MG ONCE A DAY. TAKE MELOXICAM WITH FOOD.  FOR YOUR TROUBLE SLEEPING, YOU CAN TAKE TYLENOL PM OVER THE COUNTER FOR PAIN AND SLEEPING. ALSO, PLEASE TRY NOT TO DRINK LIQUIDS 2 HOURS BEFORE BEDTIME.   YOU CAN CONSIDER TREATMENT OF YOUR DEPRESSION WITH EITHER DULOXETINE (DEPRESSION, ANXIETY AND PAIN) OR SERTRALINE (DEPRESSION AND ANXIETY).  WE WILL CHECK YOUR LABS TODAY.   FOLLOW UP IN ONE MONTH WITH YOUR PRIMARY CARE DOCTOR.

## 2016-05-28 NOTE — Assessment & Plan Note (Signed)
Patient also admits to depression for which is currently seeing a counselor, but has not been starting on antidepressants due to patient's choice. Patient is agreeable to starting an antidepressants.  Plan: -Zoloft 50 mg daily

## 2016-05-28 NOTE — Assessment & Plan Note (Signed)
Patient complains of fatigue and difficulty staying asleep for the past 2 years. Patient normally goes to bed around midnight but awakes several times during the night due to right shoulder pain which is chronic and due to acromioclavicular tendonitis (follwed by Sports Medicine) and due to polyuria. Patient has not been taking his Mobic as prescribed. He is participating in PT. Last Sports Medicine note from May 2017 recommended steroid injection if pain persisted. Patient complains of polyuria. He wakes up to urinate 4 times during the night. He admits to drinking liquids close to bedtime. He denies polydipsia. He admits to a family history of T2DM. UA shows no signs of infection or glucose, but does have trace RBCs.   Insomnia is multifactorial- likely due to nocturia, pain and depression.   Plan: -Restrict liquids 2 hours before bedtime -Take Mobic QD and Tylenol PM prn  -Start Zoloft 50 mg QD

## 2016-05-29 ENCOUNTER — Telehealth: Payer: Self-pay | Admitting: Internal Medicine

## 2016-05-29 NOTE — Telephone Encounter (Signed)
Called patient to review lab results.  HgbA1c 5.1- no evidence of diabetes.  UA showed no signs of infection, but did show Trace RBC - may be due to exercise, but we will repeat a UA in one month.   Patient voiced understanding.  Karlene LinemanAlexa Burns, DO PGY-3 Internal Medicine Resident Pager # [phone] 05/29/2016 1:00 PM

## 2016-06-05 ENCOUNTER — Ambulatory Visit (INDEPENDENT_AMBULATORY_CARE_PROVIDER_SITE_OTHER): Payer: Medicaid Other | Admitting: Family Medicine

## 2016-06-05 ENCOUNTER — Encounter: Payer: Self-pay | Admitting: Family Medicine

## 2016-06-05 DIAGNOSIS — M25511 Pain in right shoulder: Secondary | ICD-10-CM

## 2016-06-05 NOTE — Assessment & Plan Note (Signed)
Pain in the shoulder is likely related to acromioclavicular joint. 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 Pain in his trapezius and the sensation extending down his arms most likely related to muscular component. Doesn't appear to be related to any neck pathology with exam today and especially given his age. - he [facility]. - Provided scapular stabilization exercises. - Encouraged to continue mobic on a regular basis.

## 2016-06-05 NOTE — Progress Notes (Signed)
  Paul Jarvis - 25 y.o. male MRN [id number]  Date of birth: [birth year]  SUBJECTIVE:  Including CC & ROS.  Chief Complaint  Patient presents with  . Shoulder Pain     Paul Jarvis is a 25 yo M that is preenting with right shoulder pain. He was seen in May and noticed to have narrowing of his [facility] joint on U/S. He was also diagnosed with scapular dysfunction. He has intermittently taken his mobic. He notices significant improvement in his symptoms when he is taking mobic on a consistent basis. He has went to one formal PT session. He reports that he was having to sleep on the floor and this seemed to exacerbate his right shoulder pain. 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 He also reports some stiffness but travels down the medial aspect of his upper extremity to his wrist.  ROS: No unexpected weight loss, fever, chills, swelling, instability, redness, otherwise see HPI    HISTORY: Past Medical, Surgical, Social, and Family History Reviewed & Updated per EMR.   Pertinent Historical Findings include: PMSHx -  Depression with anxiety   DATA REVIEWED: 11/17/15: right shoulder x-ray: normal exam   PHYSICAL EXAM:  VS: BP:125/78  HR:65bpm  TEMP: ( )  RESP:   HT:5\' 9"  (175.3 cm)   WT:233 lb (105.7 kg)  BMI:34.5 PHYSICAL EXAM: Gen: NAD, alert, cooperative with exam, well-appearing HEENT: clear conjunctiva, EOMI CV:  no edema, capillary refill brisk,  Resp: non-labored, normal speech Skin: no rashes, normal turgor  Neuro: no gross deficits.  Psych:  alert and oriented Shoulder: Normal scapular movement with flexion and abduction. Mild scapular winging on the right side upon pushing against the wall Some tenderness to palpation of the acromioclavicular joint. Normal flexion and abduction. Normal grip strength. Normal pulses. No painful arc or drop arm sign. Neck: Normal flexion and extension. Normal right and left lateral rotation. Normal  side bends to the left. Some pain with side bending to the right in his trapezius. Negative Spurling's test.   ASSESSMENT & PLAN:   Right shoulder pain Pain in the shoulder is likely related to acromioclavicular joint. 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 Pain in his trapezius and the sensation extending down his arms most likely related to muscular component. Doesn't appear to be related to any neck pathology with exam today and especially given his age. - he will follow-up for right [facility] joint injection. - Provided scapular stabilization exercises. - Encouraged to continue mobic on a regular basis.

## 2016-06-05 NOTE — Patient Instructions (Signed)
Thank you for coming in,   [facility]    Please feel free to call with any questions or concerns at any time, at [phone]. --Dr. Jordan LikesSchmitz

## 2016-06-07 NOTE — Progress Notes (Signed)
Internal Medicine Clinic Attending  Case discussed with Dr. Lawerance BachBurns at the time of the visit.  We reviewed the resident's history and exam and pertinent patient test results.  I agree with the assessment, diagnosis, and plan of care documented in the resident's note.  I think prostate cancer is less likely in a young person but should remain on the differential if lifestyle changes does not resolve his symptoms.  If his symptoms do not resolve would need additional work up.

## 2016-06-18 ENCOUNTER — Encounter (HOSPITAL_COMMUNITY): Payer: Self-pay | Admitting: Emergency Medicine

## 2016-06-18 ENCOUNTER — Ambulatory Visit (HOSPITAL_COMMUNITY)
Admission: EM | Admit: 2016-06-18 | Discharge: 2016-06-18 | Disposition: A | Discharge status: Home / Self Care | Payer: Medicaid Other | Attending: Radiology | Admitting: Radiology

## 2016-06-18 DIAGNOSIS — M25511 Pain in right shoulder: Secondary | ICD-10-CM

## 2016-06-18 MED ORDER — CYCLOBENZAPRINE HCL 10 MG PO TABS: 10.0000 mg | ORAL_TABLET | Freq: Two times a day (BID) | ORAL | 0 refills | Status: DC | PRN

## 2016-06-18 NOTE — ED Provider Notes (Signed)
CSN: [id number]     Arrival date & time 06/18/16  1853 History   First MD Initiated Contact with [id number] 06/18/16 1953     Chief Complaint  Patient presents with  . Shoulder Pain   (Consider location/radiation/quality/duration/timing/severity/associated sxs/prior Treatment) 25 y.o. male presents with right shoulder pain intermittently X 3 years. Patient is a Teacher, English as a foreign languagebongo player and states that his is currently being treated at Sports medicine. Patient states that he was unable to get a cortisone injection as previously scheduled because he had to work that day. Patient describes the pain as "felling like my hand is not connected to body" X. Condition is chronicin nature. Condition is made better by mobic temporarily. Condition is made worse by repeatative movement. Wife at bedside is concerned that patient had a form of muscular dystrophy.        Past Medical History:  Diagnosis Date  . Anxiety   . Depression   . Migraine    History reviewed. No pertinent surgical history. History reviewed. No pertinent family history. Social History  Substance Use Topics  . Smoking status: Former Smoker    Quit date: 10/16/1999  . Smokeless tobacco: Not on file  . Alcohol use No     Comment: occ    Review of Systems  Constitutional: Negative.   Musculoskeletal: Positive for arthralgias ( right arm).    Allergies  Review of patient's allergies indicates no known allergies.  Home Medications   Prior to Admission medications   Medication Sig Start Date End Date Taking? Authorizing Provider  meloxicam (MOBIC) 15 MG tablet Take 1 tablet (15 mg total) by mouth daily. 02/28/16  Yes Guinevere ScarletBlake Williams, MD  sertraline (ZOLOFT) 50 MG tablet Take 1 tablet (50 mg total) by mouth daily. 05/28/16  Yes Alexa Lucrezia Starch Burns, MD  cyclobenzaprine (FLEXERIL) 10 MG tablet Take 1 tablet (10 mg total) by mouth 2 (two) times daily as needed for muscle spasms. 06/18/16   Alene MiresJennifer C Abygayle Deltoro, NP  doxycycline (VIBRAMYCIN) 100 MG  capsule Take 100 mg by mouth 2 (two) times daily. 05/05/16   Historical Provider, MD   Meds Ordered and Administered this Visit  Medications - No data to display  BP 132/59 (BP Location: Left Arm)   Pulse (!) 52 Comment: notified rn  Temp 98.3 F (36.8 C) (Oral)   Resp 12   SpO2 100%  No data found.   Physical Exam  Constitutional: He is oriented to person, place, and time. He appears well-developed and well-nourished.  Cardiovascular: Normal rate and regular rhythm.   Pulmonary/Chest: Effort normal and breath sounds normal.  Musculoskeletal: Normal range of motion.  Neurological: He is alert and oriented to person, place, and time.    Urgent Care Course   Clinical Course    Procedures (including critical care time)  Labs Review Labs Reviewed - No data to display  Imaging Review No results found.   Visual Acuity Review  Right Eye Distance:   Left Eye Distance:   Bilateral Distance:    Right Eye Near:   Left Eye Near:    Bilateral Near:         MDM   1. Right shoulder pain       Alene MiresJennifer C Nailyn Dearinger, NP 06/18/16 2033

## 2016-06-18 NOTE — Discharge Instructions (Signed)
Follow up with sports medicine for injections and primary care provider for blood work. Continue to perform exercises and take mobic as directed by sports medicine

## 2016-06-18 NOTE — ED Triage Notes (Signed)
[facility] a [facility] of chronic right shoulder pain that he stated has been diagnosed as an AC joint and muscle issue. The patient stated that it has gotten more irritated and painful recently due to repetative mot ion.

## 2016-06-21 ENCOUNTER — Ambulatory Visit (INDEPENDENT_AMBULATORY_CARE_PROVIDER_SITE_OTHER): Payer: Medicaid Other | Admitting: Sports Medicine

## 2016-06-21 ENCOUNTER — Encounter: Payer: Self-pay | Admitting: Sports Medicine

## 2016-06-21 VITALS — BP 134/64 | HR 82 | Ht 69.0 in | Wt 233.0 lb

## 2016-06-21 DIAGNOSIS — M25511 Pain in right shoulder: Secondary | ICD-10-CM | POA: Diagnosis present

## 2016-06-21 DIAGNOSIS — M7711 Lateral epicondylitis, right elbow: Secondary | ICD-10-CM | POA: Diagnosis not present

## 2016-06-21 DIAGNOSIS — M62838 Other muscle spasm: Secondary | ICD-10-CM

## 2016-06-21 DIAGNOSIS — M6248 Contracture of muscle, other site: Secondary | ICD-10-CM | POA: Diagnosis not present

## 2016-06-21 DIAGNOSIS — M7710 Lateral epicondylitis, unspecified elbow: Secondary | ICD-10-CM | POA: Insufficient documentation

## 2016-06-21 MED ORDER — METHYLPREDNISOLONE ACETATE 40 MG/ML IJ SUSP
40.0000 mg | Freq: Once | INTRAMUSCULAR | Status: AC
  Administered 2016-06-21: 40 mg via INTRA_ARTICULAR

## 2016-06-21 NOTE — Assessment & Plan Note (Addendum)
AC joint injected today under ultrasound.  Continue with exercises.  Gave additional exercises to help with trapezius spasm.

## 2016-06-21 NOTE — Progress Notes (Signed)
  Paul Jarvis - 25 y.o. male MRN [id number]  Date of birth: 09/02/1991  SUBJECTIVE:  Including CC & ROS.  CC: right shoulder and elbow pain  Patient presents for follow-up of right shoulder pain and also notes some right elbow pain. 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 He had some narrowing of his acromioclavicular joints previously. He is told that this did not improve with exercises and anti-inflammatories, he could have this injected. He would like to proceed with this at this time. He also complains of right elbow pain on the lateral aspect. He also has pain and paresthesias on his fourth and fifth digits. Denies any weakness. He does have a 3661-month-old at home who is in the room with him.   ROS: No unexpected weight loss, fever, chills, swelling, instability, muscle pain, +numbness/tingling, redness, otherwise see HPI   PMHx - Updated and reviewed.  Contributory factors include: Negative PSHx - Updated and reviewed.  Contributory factors include:  Negative FHx - Updated and reviewed.  Contributory factors include:  Negative Social Hx - Updated and reviewed. Contributory factors include: Negative Medications - reviewed   DATA REVIEWED: Previous office visits  PHYSICAL EXAM:  VS: BP:134/64  HR:82bpm  TEMP: ( )  RESP:   HT:5\' 9"  (175.3 cm)   WT:233 lb (105.7 kg)  BMI:34.5 PHYSICAL EXAM: Gen: NAD, alert, cooperative with exam, well-appearing HEENT: clear conjunctiva,  CV:  no edema, capillary refill brisk, normal rate Resp: non-labored Skin: no rashes, normal turgor  Neuro: no gross deficits.  Psych:  alert and oriented  Shoulder: Inspection reveals no abnormalities, atrophy or asymmetry. Palpation is with tenderness over AC joint on right, none on left, none over bicipital groove. ROM is full in all planes. Rotator cuff strength normal throughout. No signs of impingement with negative Neer and Hawkin's tests, empty can sign. Adductor sign test is positive on right Speeds and  Yergason's tests normal. No labral pathology noted with negative Obrien's, negative clunk and good stability. Normal scapular function observed. No painful arc and no drop arm sign. No apprehension sign   Elbow: Unremarkable to inspection. TTP over right lateral epicondyle. Range of motion full pronation, supination, flexion, extension. Strength is full to all of the above directions Stable to varus, valgus stress. Negative moving valgus stress test. No discrete areas of tenderness to palpation. Ulnar nerve does not sublux. Negative cubital tunnel Tinel's.  Ultrasound of right shoulder, limited.  Acromioclavicular joint with effusion, consistent with inflammation of AC joint.  ASSESSMENT & PLAN:   Right shoulder pain AC joint injected today.  Continue with exercises.  Gave additional exercises to help with trapezius spasm.    Lateral epicondylitis (tennis elbow) Lateral epicondylitis brace given.  Exercises given and demonstrated in office.  Follow up in 4-6 weeks.    Consent obtained and verified. Sterile betadine prep. Furthur cleansed with alcohol. Topical analgesic spray: Ethyl chloride. Joint: AC joint Approached in typical fashion with: ultrasound guided from superior.  Short axis view Completed without difficulty Meds: 40 mg depomedrol and 1 cc 1% lidocaine Needle: 25G Aftercare instructions and Red flags advised.

## 2016-06-21 NOTE — Assessment & Plan Note (Signed)
Lateral epicondylitis brace given.  Exercises given and demonstrated in office.  Follow up in 4-6 weeks.

## 2016-10-22 ENCOUNTER — Ambulatory Visit (INDEPENDENT_AMBULATORY_CARE_PROVIDER_SITE_OTHER): Payer: Medicaid Other | Admitting: Internal Medicine

## 2016-10-22 ENCOUNTER — Encounter (INDEPENDENT_AMBULATORY_CARE_PROVIDER_SITE_OTHER): Payer: Self-pay

## 2016-10-22 ENCOUNTER — Encounter: Payer: Self-pay | Admitting: Internal Medicine

## 2016-10-22 DIAGNOSIS — R634 Abnormal weight loss: Secondary | ICD-10-CM

## 2016-10-22 DIAGNOSIS — Z1329 Encounter for screening for other suspected endocrine disorder: Secondary | ICD-10-CM

## 2016-10-22 DIAGNOSIS — Z0000 Encounter for general adult medical examination without abnormal findings: Secondary | ICD-10-CM

## 2016-10-22 DIAGNOSIS — F418 Other specified anxiety disorders: Secondary | ICD-10-CM | POA: Diagnosis present

## 2016-10-22 MED ORDER — SERTRALINE HCL 50 MG PO TABS: 50.0000 mg | ORAL_TABLET | Freq: Every day | ORAL | 0 refills | Status: DC

## 2016-10-22 NOTE — Progress Notes (Signed)
   CC: Depression HPI: Mr.Benigno Melvyn NethLewis is a 26 y.o. man with no significant past medical history except for depression/anxiety, here for follow up regarding depression, and is interested in screening for thyroid.  Please see Problem List/A&P for the status of the patient's chronic medical problems   Past Medical History:  Diagnosis Date  . Anxiety   . Depression   . Migraine     Review of Systems:  Constitutional: Negative for fever, chills, feels fatigued sometimes, and has some weight loss of 10-15 lbs over 6 months.  HEENT: No headaches,  Respiratory: Negative for cough, shortness of breath and wheezing.  Gastrointestinal: Negative for heartburn, nausea, vomiting, abdominal pain, diarrhea and constipation. Musculoskeletal: has joint pains sometimes- feels like joints are stiff  Neurological: Negative for tingling  Physical Exam: Vitals:   10/22/16 1459  BP: (!) 152/66  SpO2: 100%  Weight: 215 lb 12.8 oz (97.9 kg)    General: A&O, in NAD, stocky build  HEENT: MMM, ncat  Neck: supple, midline trachea,  CV: RRR, normal s1, s2, no m/r/g,  Resp: equal and symmetric breath sounds, no wheezing or crackles  Abdomen: soft, nontender, nondistended +BS Skin: warm, dry, intact, no open lesions or rashes Extremities: no edema  Assessment & Plan:   See encounters tab for problem based medical decision making. Patient discussed with Dr. Josem KaufmannKlima

## 2016-10-22 NOTE — Assessment & Plan Note (Addendum)
Patient is here as wife noticed symptoms of depression who was present in the room. A PHQ 9 scale was 13 indicating moderate depression. He says he feels fatigued at times. He goes to bed at 8.30 and wakes up at 8 AM. 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 He wakes up at night sometimes. He feels like sometimes he has to take a nap during the day but he does not. He does not randomly fall asleep per wife. Denies SI. Has normal level of energy.  Denies alcohol intake, or smoking or illicit drugs.    In August 2017, Zoloft was prescribed but pt only took for 4 weks max- (even less per my gestalt) and said henoticed some improvement overall but stopped taking it afterwards. He has tried behavioral therapy in the past but does not want to do it.  They were interested in screening for thyroid as wife had gone to a community center and asked the NP and the NP refused a TSH screen. She then talked to a friend and the friend recommended getting "TSH, free t4, and T3". I explained that we normally only screen for TSH. He denies any family history of thyroid conditions. He has some heat intolerance. He has some weight loss of 10-15lbs unintentional in last 6 months.   Assessment: moderate untreated depression in a 26 year old man previously on a short trial of pharmacotherapy.  Plan -Restart Zoloft 50 mg daily -Screen TSH -RTC in 6-8 weeks.   Addendum: Informed wife of the pt's TSH - wife was present in the room yesterday. - That TSH was normal. Wife was concerned and is waiting for a 'diagnosis'. Says pt has some right sided diffuse body pain- - neck pain, joint pain etc. Wondering what the cause may be.   Can check ferritin, and vitamiN D at next visit.

## 2016-10-22 NOTE — Patient Instructions (Addendum)
Thank you for your visit today  Please take the zoloft daily. It may take 6-8 weeks until you notice some improvement- so please try to take it daily.  I will call you with the result for the thyroid test   Please follow up here in 6-8 weeks.

## 2016-10-22 NOTE — Assessment & Plan Note (Signed)
Pt denied influenza vaccine. Explained the risks of not getting a vaccine that the flu rate for unvaccinated individuals is high this season. Still declined.

## 2016-10-23 ENCOUNTER — Telehealth: Payer: Self-pay | Admitting: Internal Medicine

## 2016-10-23 LAB — TSH: TSH: 0.709 u[IU]/mL (ref 0.450–4.500)

## 2016-10-23 NOTE — Telephone Encounter (Signed)
Informed the pt's wife of the results- Please see addendum in the clinic note

## 2016-10-24 NOTE — Progress Notes (Signed)
Case discussed with Dr. Saraiya at the time of the visit.  We reviewed the resident's history and exam and pertinent patient test results.  I agree with the assessment, diagnosis and plan of care documented in the resident's note. 

## 2016-11-30 ENCOUNTER — Ambulatory Visit: Payer: Medicaid Other

## 2016-11-30 ENCOUNTER — Encounter (INDEPENDENT_AMBULATORY_CARE_PROVIDER_SITE_OTHER): Payer: Self-pay

## 2016-11-30 ENCOUNTER — Encounter: Payer: Self-pay | Admitting: Internal Medicine

## 2017-02-06 ENCOUNTER — Encounter: Payer: Medicaid Other | Admitting: Internal Medicine

## 2017-11-07 ENCOUNTER — Ambulatory Visit: Payer: BLUE CROSS/BLUE SHIELD | Admitting: Internal Medicine

## 2017-11-07 VITALS — BP 105/74 | HR 74 | Temp 98.0°F | Wt 240.8 lb

## 2017-11-07 DIAGNOSIS — R441 Visual hallucinations: Secondary | ICD-10-CM

## 2017-11-07 DIAGNOSIS — M7711 Lateral epicondylitis, right elbow: Secondary | ICD-10-CM

## 2017-11-07 DIAGNOSIS — M7710 Lateral epicondylitis, unspecified elbow: Secondary | ICD-10-CM

## 2017-11-07 DIAGNOSIS — M25511 Pain in right shoulder: Secondary | ICD-10-CM

## 2017-11-07 DIAGNOSIS — F319 Bipolar disorder, unspecified: Secondary | ICD-10-CM

## 2017-11-07 DIAGNOSIS — M62838 Other muscle spasm: Secondary | ICD-10-CM

## 2017-11-07 DIAGNOSIS — R440 Auditory hallucinations: Secondary | ICD-10-CM | POA: Diagnosis not present

## 2017-11-07 DIAGNOSIS — G8929 Other chronic pain: Secondary | ICD-10-CM

## 2017-11-07 DIAGNOSIS — M542 Cervicalgia: Secondary | ICD-10-CM | POA: Diagnosis not present

## 2017-11-07 DIAGNOSIS — F312 Bipolar disorder, current episode manic severe with psychotic features: Secondary | ICD-10-CM

## 2017-11-07 MED ORDER — MELOXICAM 7.5 MG PO TABS: 7.5000 mg | ORAL_TABLET | Freq: Every day | ORAL | 2 refills | Status: DC

## 2017-11-07 NOTE — Patient Instructions (Signed)
For your shoulder pain and joint pain, I have given you mobic 7.5mg  daily. I will place a referral to Sports Medicine for injections as this seemed to have helped last time. Continue using heat/ice and try doing the exercises regularly which they have shown you previously.  For your possible bipolar disorder, I will place a psychiatry referral - this will take a couple of weeks to get scheduled.  In the mean time, I want you to go to on of the following, which are walk in clinics while you wait for an appointment: 1. [facility] - [address], [place] 2. [facility] - [address], [place] They will be able to start appropriate medications and provide counseling as well until you get in to a regular psychiatry office.   If you have thoughts of hurting yourself or hurting someone else, please call 911, go to the [facility] ([address], [place]) or go to any ED.
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
## 2017-11-09 ENCOUNTER — Encounter: Payer: Self-pay | Admitting: Internal Medicine

## 2017-11-09 NOTE — Assessment & Plan Note (Signed)
Advised to restart exercises, heat/ice.

## 2017-11-09 NOTE — Assessment & Plan Note (Signed)
Advised use of heat with neck/shoulder stretching.

## 2017-11-09 NOTE — Assessment & Plan Note (Addendum)
Patient with h/o depression and what is consistent with manic episode with persistent psychotic features during current depressive episode. Denies SI/HI. This seems to be consistent with diagnosis of bipolar depression with psychotic features.  Currently, thoughts are organized, w/o delusions, s/o psychomotor agitation.  Patient not an immediate threat to himself or others that would warrant [facility].  Would not feel comfortable starting medications today due to complexity of psych disorder.  [facility] resources.  Plan: --psych referral placed --patient given info to [facility] or [facility] which provide walk in psych appointments, until referral is processed and appointment made. --advised to go to ED if having SI/HI, worsening hallucinations or loss of control --patient in agreement with plan --has f/u in 1 week with PCP

## 2017-11-09 NOTE — Assessment & Plan Note (Signed)
Patient with progressive AC joint tendonitis.  Plan: --[facility] sports med [facility] --advised patient to restart exercises --advised use of heat/ice --mobic 7.5mg  daily as previously provided some relief, more than naproxen which he is currently taking.

## 2017-11-11 NOTE — Progress Notes (Signed)
Medicine attending: Medical history, presenting problems, physical findings, and medications, reviewed with resident physician Dr Nyra MarketGorica Svalina on the day of the patient visit and I concur with her evaluation and management plan. Young man with chronic depression. Concern with bipolar disorder or schizophrenia with recent auditory>visual hallucinations. We contacted our behavioral health team; gave the patient a number of resources he can access while waiting for formal psychiatric evaluation.

## 2017-11-13 ENCOUNTER — Encounter: Payer: Self-pay | Admitting: Internal Medicine

## 2017-11-14 ENCOUNTER — Encounter: Payer: Self-pay | Admitting: Internal Medicine

## 2017-11-14 ENCOUNTER — Other Ambulatory Visit: Payer: Self-pay

## 2017-11-14 ENCOUNTER — Ambulatory Visit: Payer: BLUE CROSS/BLUE SHIELD | Admitting: Internal Medicine

## 2017-11-14 VITALS — BP 129/68 | HR 60 | Temp 97.8°F | Ht 69.0 in | Wt 239.7 lb

## 2017-11-14 DIAGNOSIS — M25511 Pain in right shoulder: Secondary | ICD-10-CM

## 2017-11-14 DIAGNOSIS — G8929 Other chronic pain: Secondary | ICD-10-CM

## 2017-11-14 DIAGNOSIS — M6289 Other specified disorders of muscle: Secondary | ICD-10-CM

## 2017-11-14 DIAGNOSIS — F319 Bipolar disorder, unspecified: Secondary | ICD-10-CM

## 2017-11-14 DIAGNOSIS — F99 Mental disorder, not otherwise specified: Secondary | ICD-10-CM

## 2017-11-14 DIAGNOSIS — M549 Dorsalgia, unspecified: Secondary | ICD-10-CM | POA: Insufficient documentation

## 2017-11-14 DIAGNOSIS — M545 Low back pain: Secondary | ICD-10-CM | POA: Diagnosis not present

## 2017-11-14 DIAGNOSIS — Z791 Long term (current) use of non-steroidal anti-inflammatories (NSAID): Secondary | ICD-10-CM | POA: Diagnosis not present

## 2017-11-14 DIAGNOSIS — Z8659 Personal history of other mental and behavioral disorders: Secondary | ICD-10-CM

## 2017-11-14 DIAGNOSIS — F418 Other specified anxiety disorders: Secondary | ICD-10-CM

## 2017-11-14 NOTE — Assessment & Plan Note (Addendum)
Assessment Patient has diffuse myalgias and "tightness" that have been going on for several years. This includes his right shoulder, right arm, lower back, left leg, abdomen, and rib cage. He describes that these areas feel "inflamed" and "sensitive". Shoulder x-rays from 2016 and 2017 are normal. TSH normal, HIV negative. He reports meloxicam is somewhat helpful. He has tried Flexeril with no relief. He is to get cortisone injections into his right shoulder, however he has not been back to sports medicine in about 2 years because he lost his insurance. He has not tried heat or ice pads or exercises regularly.  Unclear etiology of his chronic diffuse muscle tightness. Possibly fibromyalgia vs rheumatologic disease vs myositis vs somatization disorder. Will check for inflammatory markers as well as rheumatologic antibodies. Can consider rheumatology referral if these return positive. Referrals placed for psych and sports medicine. In the meantime, will treat his pain. During this discussion, patient's wife expressed frustration that we were only providing symptomatic treatment and requested referral to neurology for further evaluation. We discussed that psychiatry does have training in neurology and will be likely be more helpful initially to assist with evaluation of his symptoms as he describes them. We will await psychiatry evaluation prior to recommending neurology referral. Patient agreeable to this plan.  Plan - Check ANA, anti-CCP, CK, aldolase, ESR, CRP - Psych referral placed - Sports Medicine referral placed - Advised patient to try capsaicin cream or lidocaine patches for certain areas if bothersome - Continue meloxicam 7.70m daily - Return to clinic in 4 weeks

## 2017-11-14 NOTE — Progress Notes (Signed)
Medicine attending: I personally interviewed and examined this patient on the day of the patient visit and reviewed pertinent clinical ,laboratory, and radiographic data  with resident physician Dr. Scherrie GerlachJennifer Huang and we discussed a management plan. Short interval follow up visit due to our concerns last week that he needs expeditious Psychiatric attention for schizophrenia vs bipolar disorder. He did not use the references we gave him for acute intervention. Not hearing voices or having hallucinations at this time. Wife here - they have located a private Psychiatric office & will schedule an appt.. We reinforced importance of early evaluation. His main complaint is chronic right shoulder pain and migratory muscle pain; no joint complaints.  Complete neuro exam by me today is normal including funduscopic. No muscle tenderness on palpation. No joint swelling/deformity. He is a percussionist by occupation & many of his musculoskeletal problems may be related to this despite asymmetry of sxs right arm>left. He is getting partial relief w mobic. We suggested changing to an alternative NSAID. We will screen him for collagen vasc dis & myopathy. If positive, then referral to Rheumatology. I informed him, Psychiatrists are also trained in Neurology. Highest priority right now is to see a Psychiatrist and get on appropriate medication. Psychiatrist can recommend a Neurologist if indicated.

## 2017-11-14 NOTE — Patient Instructions (Addendum)
FOLLOW-UP INSTRUCTIONS When: 4 weeks For: health maintenance What to bring: medications   Paul Jarvis,  It was a pleasure to meet you today.  For your right shoulder and lower back pain, you can try lidocaine patches or capsaicin cream. You can buy both of these over the counter. You can continue to use the meloxicam as well. The sports medicine referral is in the process of going through. Their office should call you to schedule an appointment.  For your possible bipolar disorder, a psychiatry referral has been placed - this will take a couple of weeks to get scheduled.  In the mean time, I want you to go to one of the following, which are walk in clinics while you wait for an appointment: 1. [facility] - [address], [place] 2. [facility] - [address], [place] They will be able to start appropriate medications and provide counseling as well until you get in to a regular psychiatry office.   If you have thoughts of hurting yourself or hurting someone else, please call 911, go to the [facility] ([address], [place]) or go to any ED.   We are checking some labwork today. I will let you know if the results are abnormal.  Please return to our clinic in 4 weeks for follow-up.

## 2017-11-14 NOTE — Assessment & Plan Note (Signed)
Assessment Possibly progression of AC joint tendonitis vs a part of his diffuse muscle tightness. Advised patient to continue use of meloxicam and placed sports medicine referral.  Plan - Referral to sports medicine placed - Continue meloxicam 7.5mg  daily - Advised patient to use capsaicin cream or lidocaine patches PRN

## 2017-11-14 NOTE — Progress Notes (Signed)
   CC: diffuse muscle aches  HPI:  [name] is a 27 y.o. male with PMH of chronic joint pain and ?bipolar disorder who presents with diffuse muscle/joint aches.  Muscle aches: He reports chronic right shoulder, right arm, lower back, and left lower extremity pain for the last several years. He states that his arm occasionally feels numb. He describes the sensation as "inflamed", "sensitive", and full body "tightness". He feels he is hindered from taking deep breaths because his right rib cage feels tight and his stomach occasionally feels tight as well. He also describes that his left ankle feels weak, his left shoulder feels tight, and complains of cracking in his right wrist that is not present on his left wrist. He also complains of numbness over his left occipital area compared to the right. He states that he has tried multiple things, including going to the chiropractor and Flexeril with minimal relief. He used to go to sports medicine 2 years ago for cortisone injections into his right shoulder, however he lost insurance last year and was not able to go back.he was seen last week in clinic and was recommended to continue meloxicam 7.5 mg once a day and to try heat and ice pads, as well as to continue exercises that he received from sports medicine in the past. He states he has not been able to do the exercises because it causes more irritation. He has not tried heat and ice pads. He does report some relief with the meloxicam.  During our discussion, patient's wife expressed frustration that we were only providing symptomatic treatment and not evaluating the underlying etiology. She requested referral to neurology for further evaluation.  ?Bipolar disorder: he was on Zoloft for depression/anxiety approximately 2 years ago. He states he was on this for one month however he had a manic episode while on the medication, so he stopped taking it. Last year, he was restarted on Zoloft for about 2  months, but stopped taking this medicine as well because he lost his insurance. He was seen last week and and was noted to have auditory hallucinations. There is concern for schizophrenia versus bipolar disorder He was recommended to go to a walk in psychiatry clinic, however the patient states he never went.e currently states that his symptoms feel more like paranoia, anxiety, and fear. Denies SI/HI.  Past Medical History:  Diagnosis Date  . Anxiety   . Depression   . Migraine    Review of Systems:   GEN: Negative for fevers. MSK: Positive for diffuse muscle aches PSYCH: Denies SI/HI  Physical Exam:  Vitals:   11/14/17 1035  BP: 129/68  Pulse: 60  Temp: 97.8 F (36.6 C)  TempSrc: Oral  SpO2: 100%  Weight: 239 lb 11.2 oz (108.7 kg)  Height: 5\' 9"  (1.753 m)   GEN: Young male sitting in chair in NAD CV: NR & RR, no m/r/g PULM: CTAB, no wheezes or rales ABD: Soft, NT, ND, +BS EXT: Full ROM of right shoulder. Negative empty can test. Tenderness to palpation of right trapezius and R forearm. No swelling or erythema evident. SKIN: No rashes or lesions evident NEURO: CN II-XII grossly intact. 5/5 strength in all 4 extremities. Decreased sensation in right forearm and right hand. 2+ patellar and bicep reflexes. Speech fluent and appropriate. PSYCH: Normal mood and affect, appropriate thought content and speech.  Assessment & Plan:   See Encounters Tab for problem based charting.  Patient seen with Dr. Cyndie ChimeGranfortuna

## 2017-11-14 NOTE — Assessment & Plan Note (Addendum)
Assessment Patient has a history of depression, previously on zoloft. He describes what sounds like a possible manic episode with psychotic features a couple years ago. Denies SI/HI. He may have bipolar disorder with psychotic features vs schizophrenia. At his last visit, he was recommended to follow up with a walk in psychiatry clinic, however he states he never went.   I do not feel comfortable starting medications today due to the complexity of his psychiatric disorder. We are working on getting him into see a psychiatrist, as I think this is crucial for helping to treat his psychiatric symptoms as well as potentially his diffuse myalgias.  Plan - Psych referral placed - Patient given [facility] for [facility] or [facility] if needed - F/u in 4 weeks

## 2017-11-16 LAB — SEDIMENTATION RATE: Sed Rate: 2 mm/hr (ref 0–15)

## 2017-11-16 LAB — C-REACTIVE PROTEIN: CRP: 0.8 mg/L (ref 0.0–4.9)

## 2017-11-16 LAB — CK: Total CK: 231 U/L — ABNORMAL HIGH (ref 24–204)

## 2017-11-16 LAB — ALDOLASE: Aldolase: 6.3 U/L (ref 3.3–10.3)

## 2017-11-16 LAB — ANTINUCLEAR ANTIBODIES, IFA: ANA Titer 1: NEGATIVE

## 2017-11-16 LAB — CYCLIC CITRUL PEPTIDE ANTIBODY, IGG/IGA: Cyclic Citrullin Peptide Ab: 8 units (ref 0–19)

## 2017-11-18 DIAGNOSIS — F3132 Bipolar disorder, current episode depressed, moderate: Secondary | ICD-10-CM | POA: Diagnosis not present

## 2017-11-19 ENCOUNTER — Encounter: Payer: Self-pay | Admitting: Internal Medicine

## 2017-11-20 ENCOUNTER — Telehealth: Payer: Self-pay | Admitting: Internal Medicine

## 2017-11-20 ENCOUNTER — Telehealth: Payer: Self-pay

## 2017-11-20 ENCOUNTER — Encounter: Payer: Self-pay | Admitting: Internal Medicine

## 2017-11-20 ENCOUNTER — Ambulatory Visit: Payer: BLUE CROSS/BLUE SHIELD | Admitting: Family Medicine

## 2017-11-20 VITALS — BP 128/90 | Ht 69.5 in | Wt 239.0 lb

## 2017-11-20 DIAGNOSIS — M5412 Radiculopathy, cervical region: Secondary | ICD-10-CM

## 2017-11-20 DIAGNOSIS — G8929 Other chronic pain: Secondary | ICD-10-CM

## 2017-11-20 DIAGNOSIS — M25511 Pain in right shoulder: Secondary | ICD-10-CM | POA: Diagnosis not present

## 2017-11-20 MED ORDER — METHYLPREDNISOLONE ACETATE 40 MG/ML IJ SUSP
20.0000 mg | Freq: Once | INTRAMUSCULAR | Status: AC
  Administered 2017-11-20: 20 mg via INTRA_ARTICULAR

## 2017-11-20 MED ORDER — DICLOFENAC SODIUM 75 MG PO TBEC
75.0000 mg | DELAYED_RELEASE_TABLET | Freq: Two times a day (BID) | ORAL | 1 refills | Status: DC
Start: 2017-11-20 — End: 2017-11-25

## 2017-11-20 NOTE — Telephone Encounter (Signed)
I have not met Mr. Roback. Maybe one of the physicians who has seen him in clinic can provide better guidance.

## 2017-11-20 NOTE — Patient Instructions (Addendum)
You have two issues going on: [facility] and cervical radiculopathy. You were given a [facility]one [facility] today. I would recommend against prednisone as it can exacerbate bipolar. Voltaren 75mg  twice a day with food for pain and inflammation. Stop the meloxicam. Simple range of motion exercises within limits of pain to prevent further stiffness. Start physical therapy for stretching, exercises, traction, and modalities. Heat 15 minutes at a time 3-4 times a day to help with spasms. Watch head position when on computers, texting, when sleeping in bed - should in line with back to prevent further nerve traction and irritation. Consider home traction unit if you get benefit with this in physical therapy. If not improving we will consider an MRI. Follow up with me in 6 weeks for reevaluation.

## 2017-11-20 NOTE — Telephone Encounter (Signed)
Requesting lab result. Please call back. 

## 2017-11-20 NOTE — Telephone Encounter (Signed)
Pt would like to a Referral to a Neurologist as he feels that [facility] isn't really helping him with his Shoulder Pain.

## 2017-11-21 ENCOUNTER — Encounter: Payer: Self-pay | Admitting: Family Medicine

## 2017-11-21 NOTE — Progress Notes (Signed)
PCP: Darreld Mclean, MD  Subjective:   HPI: Patient is a 27 y.o. male here for right shoulder pain.  Patient has been seen previously over a year ago for pain in his right shoulder with some paresthesias into his left hand. He responded well previously to a cortisone injection in the [facility] joint on right side. He's currently taking mobic. Recalls mid-January of last year was when this really started bothering him. Pain is superior right shoulder, under scapula, and into right arm. Some numbness into his hand more on radial side. Pain level 8/10 and sharp. He is right handed. Plays the bongos. No skin changes.   Past Medical History:  Diagnosis Date  . Anxiety   . Depression   . Migraine     Current Outpatient Medications on File Prior to Visit  Medication Sig Dispense Refill  . meloxicam (MOBIC) 7.5 MG tablet Take 1 tablet (7.5 mg total) by mouth daily. 30 tablet 2   No current facility-administered medications on file prior to visit.     History reviewed. No pertinent surgical history.  No Known Allergies  Social History   Socioeconomic History  . Marital status: Married    Spouse name: Not on file  . Number of children: Not on file  . Years of education: Not on file  . Highest education level: Not on file  Social Needs  . Financial resource strain: Not on file  . Food insecurity - worry: Not on file  . Food insecurity - inability: Not on file  . Transportation needs - medical: Not on file  . Transportation needs - non-medical: Not on file  Occupational History  . Not on file  Tobacco Use  . Smoking status: Former Smoker    Last attempt to quit: 10/16/1999    Years since quitting: 18.1  . Smokeless tobacco: Never Used  Substance and Sexual Activity  . Alcohol use: No    Alcohol/week: 0.0 oz    Comment: occ  . Drug use: No    Comment: previous use of marijuana  . Sexual activity: Yes  Other Topics Concern  . Not on file  Social History Narrative  . Not on  file    History reviewed. No pertinent family history.  BP 128/90   Ht 5' 9.5" (1.765 m)   Wt 239 lb (108.4 kg)   BMI 34.79 kg/m   Review of Systems: See HPI above.     Objective:  Physical Exam:  Gen: NAD, comfortable in exam room  Neck: No gross deformity, swelling, bruising. TTP right cervical paraspinal region, trapezius, medial to scapula on right.  No midline/bony TTP. FROM with pain on right lateral rotation. BUE strength 5/5. Sensation intact to light touch currently. 2+ equal reflexes in triceps, biceps, brachioradialis tendons. NV intact distal BUEs.  Right shoulder: No swelling, ecchymoses.  No gross deformity. TTP AC joint, right trapezius and medial to scapula.  No other tenderness.   FROM. Negative Hawkins, Neers. Negative Yergasons. Positive crossover. Strength 5/5 with empty can and resisted internal/external rotation. Negative apprehension. NV intact distally.  Left shoulder: No swelling, ecchymoses.  No gross deformity. No TTP. FROM. Strength 5/5 with empty can and resisted internal/external rotation. NV intact distally.   Assessment & Plan:  1. Neck/right shoulder pain - 2/2 combination of AC joint synovitis and cervical radiculopathy.  Exam is reassuring otherwise.  He will stop meloxicam and start voltaren twice a day.  AC injection repeated today.  Start physical therapy for neck/shoulder.  Discussed ergonomic issues.  F/u in 6 weeks.  After informed written consent timeout was performed, patient was seated on exam table. Right AC joint was identified with ultrasound, prepped with alcohol swab and injected with 0.5:0.235mL lidocaine : depomedrol. Patient tolerated the procedure well without immediate complications.

## 2017-11-21 NOTE — Assessment & Plan Note (Signed)
2/2 combination of AC joint synovitis and cervical radiculopathy.  Exam is reassuring otherwise.  He will stop meloxicam and start voltaren twice a day.  AC injection repeated today.  Start physical therapy for neck/shoulder.  Discussed ergonomic issues.  F/u in 6 weeks.  After informed written consent timeout was performed, patient was seated on exam table. Right AC joint was identified with ultrasound, prepped with alcohol swab and injected with 0.5:0.575mL lidocaine : depomedrol. Patient tolerated the procedure well without immediate complications.

## 2017-11-22 NOTE — Telephone Encounter (Signed)
See Dr Phylliss BobHuang's telephone note.

## 2017-11-25 ENCOUNTER — Telehealth: Payer: Self-pay | Admitting: *Deleted

## 2017-11-25 DIAGNOSIS — M25511 Pain in right shoulder: Principal | ICD-10-CM

## 2017-11-25 DIAGNOSIS — G8929 Other chronic pain: Secondary | ICD-10-CM

## 2017-11-25 NOTE — Telephone Encounter (Signed)
Patient stated he was having more relief from the mobic than voltaren. Told pt to d/c voltaren and to begin mobic 7.5mg  bid today and if not better by wed to call back and we will probably order MRI per Dr Lazaro ArmsHudnall's plan. Patient agreed

## 2017-12-11 ENCOUNTER — Ambulatory Visit: Payer: BLUE CROSS/BLUE SHIELD | Admitting: Family Medicine

## 2017-12-13 ENCOUNTER — Encounter: Payer: Self-pay | Admitting: Family Medicine

## 2017-12-13 ENCOUNTER — Ambulatory Visit: Payer: BLUE CROSS/BLUE SHIELD | Admitting: Family Medicine

## 2017-12-13 VITALS — BP 152/80 | Ht 69.5 in | Wt 239.0 lb

## 2017-12-13 DIAGNOSIS — G8929 Other chronic pain: Secondary | ICD-10-CM

## 2017-12-13 DIAGNOSIS — M25511 Pain in right shoulder: Secondary | ICD-10-CM | POA: Diagnosis not present

## 2017-12-13 MED ORDER — METHYLPREDNISOLONE ACETATE 40 MG/ML IJ SUSP
40.0000 mg | Freq: Once | INTRAMUSCULAR | Status: AC
  Administered 2017-12-13: 40 mg via INTRA_ARTICULAR

## 2017-12-13 NOTE — Progress Notes (Signed)
    CHIEF COMPLAINT / HPI:  Right shoulder pain. Has had problems with this for a while and has been seeing Dr. Pearletha ForgeHudnall. He received a acromioclavicular joint injection at last office visit. That did not seem to help much. About 2 years ago he had a different kind of injection that was given to him that had seemed to help. He was hoping the acromioclavicular joint injection would do the same. He does not feel like he is able to move forward with physical therapy this time.  He has pain with forward flexion and internal rotation. 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  REVIEW OF SYSTEMS: No unusual weight change, no fever. No numbness or tingling in the hand. He does have chronic right wrist pain.  PERTINENT  PMH / PSH: I have reviewed the patient's medications, allergies, past medical and surgical history, smoking status and updated in the EMR as appropriate.   OBJECTIVE: GEN.: Well-developed male no acute distress in SHOULDER: Right. External rotation is full and painless. Internal rotation is painful but he can place his hand the lumbar portion of his back. He cannot perform push off test. Supraspinatus testing is mildly painful but he has intact strength. There is no tenderness over the acromioclavicular joint and no tenderness over the biceps tendon. SKIN: Skin around the right shoulder area is without any sign of erythema worse rash. This VASCULAR: Radial pulses are 2+ bilaterally symmetrical  PROCEDURE: INJECTION: Patient was given informed consent, signed copy in the chart. Appropriate time out was taken. Area prepped and draped in usual sterile fashion. Ethyl chloride was  used for local anesthesia. A 21 gauge 1 1/2 inch needle was used.. 1 cc of methylprednisolone 40 mg/ml plus  4 cc of 1% lidocaine without epinephrine was injected into the right subacromial bursa of the shoulder joint using a(n) posterior approach.   The patient tolerated the procedure  well. There were no complications. Post procedure instructions were given.   ASSESSMENT / PLAN: Please see problem oriented charting for details  Additionally he wanted to talk about some chronic right wrist pain but I told him we would take care of the shoulder first and address that at a future office visit.

## 2017-12-13 NOTE — Assessment & Plan Note (Signed)
I suspect he has multifactorial problems with the shoulder. 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 He really wants to proceed with MRI so we will set that up. Also gave him a subacromial bursa injection today to see if that will give him some improved pain relief. I'll see him back after the MRI.

## 2017-12-18 ENCOUNTER — Ambulatory Visit: Payer: BLUE CROSS/BLUE SHIELD | Admitting: Physical Therapy

## 2017-12-19 ENCOUNTER — Ambulatory Visit: Payer: BLUE CROSS/BLUE SHIELD | Admitting: Physical Therapy

## 2017-12-24 ENCOUNTER — Ambulatory Visit
Admission: RE | Admit: 2017-12-24 | Discharge: 2017-12-24 | Disposition: A | Discharge status: Home / Self Care | Payer: BLUE CROSS/BLUE SHIELD | Source: Ambulatory Visit | Attending: Family Medicine | Admitting: Family Medicine

## 2017-12-24 DIAGNOSIS — M25511 Pain in right shoulder: Principal | ICD-10-CM

## 2017-12-24 DIAGNOSIS — G8929 Other chronic pain: Secondary | ICD-10-CM

## 2017-12-25 ENCOUNTER — Encounter: Payer: Self-pay | Admitting: Family Medicine

## 2017-12-25 ENCOUNTER — Telehealth: Payer: Self-pay | Admitting: Family Medicine

## 2017-12-25 NOTE — Telephone Encounter (Addendum)
Left message on his voicemail regarding MRI results.  Sent letter. Rotator cuff Is intact.  Severe changes of the acromioclavicular joint.  He might from an acromioplasty.  We would need to set him up surgeon for evaluation.  He will call back and let me know if he wants appt made with ortho Dion Saucier([place]?)

## 2017-12-31 ENCOUNTER — Ambulatory Visit: Payer: BLUE CROSS/BLUE SHIELD | Attending: Internal Medicine | Admitting: Physical Therapy

## 2017-12-31 ENCOUNTER — Ambulatory Visit: Payer: BLUE CROSS/BLUE SHIELD | Admitting: Physical Therapy

## 2018-01-01 ENCOUNTER — Ambulatory Visit: Payer: BLUE CROSS/BLUE SHIELD | Admitting: Family Medicine

## 2018-01-02 ENCOUNTER — Encounter (HOSPITAL_COMMUNITY): Payer: Self-pay

## 2018-01-02 ENCOUNTER — Emergency Department (HOSPITAL_COMMUNITY)
Admission: EM | Admit: 2018-01-02 | Discharge: 2018-01-03 | Disposition: A | Discharge status: Home / Self Care | Payer: BLUE CROSS/BLUE SHIELD | Attending: Emergency Medicine | Admitting: Emergency Medicine

## 2018-01-02 DIAGNOSIS — F419 Anxiety disorder, unspecified: Secondary | ICD-10-CM | POA: Insufficient documentation

## 2018-01-02 DIAGNOSIS — M25531 Pain in right wrist: Secondary | ICD-10-CM | POA: Diagnosis not present

## 2018-01-02 DIAGNOSIS — M79601 Pain in right arm: Secondary | ICD-10-CM | POA: Diagnosis not present

## 2018-01-02 DIAGNOSIS — Y92410 Unspecified street and highway as the place of occurrence of the external cause: Secondary | ICD-10-CM | POA: Diagnosis not present

## 2018-01-02 DIAGNOSIS — S29012A Strain of muscle and tendon of back wall of thorax, initial encounter: Secondary | ICD-10-CM | POA: Insufficient documentation

## 2018-01-02 DIAGNOSIS — M545 Low back pain: Secondary | ICD-10-CM | POA: Diagnosis not present

## 2018-01-02 DIAGNOSIS — Y9389 Activity, other specified: Secondary | ICD-10-CM | POA: Insufficient documentation

## 2018-01-02 DIAGNOSIS — Z87891 Personal history of nicotine dependence: Secondary | ICD-10-CM | POA: Insufficient documentation

## 2018-01-02 DIAGNOSIS — F329 Major depressive disorder, single episode, unspecified: Secondary | ICD-10-CM | POA: Insufficient documentation

## 2018-01-02 DIAGNOSIS — Y998 Other external cause status: Secondary | ICD-10-CM | POA: Insufficient documentation

## 2018-01-02 DIAGNOSIS — S199XXA Unspecified injury of neck, initial encounter: Secondary | ICD-10-CM | POA: Diagnosis not present

## 2018-01-02 DIAGNOSIS — S161XXA Strain of muscle, fascia and tendon at neck level, initial encounter: Secondary | ICD-10-CM | POA: Diagnosis not present

## 2018-01-02 DIAGNOSIS — M546 Pain in thoracic spine: Secondary | ICD-10-CM | POA: Diagnosis not present

## 2018-01-02 DIAGNOSIS — Z79899 Other long term (current) drug therapy: Secondary | ICD-10-CM | POA: Insufficient documentation

## 2018-01-02 DIAGNOSIS — M25511 Pain in right shoulder: Secondary | ICD-10-CM | POA: Diagnosis not present

## 2018-01-02 DIAGNOSIS — M542 Cervicalgia: Secondary | ICD-10-CM | POA: Diagnosis not present

## 2018-01-02 DIAGNOSIS — T148XXA Other injury of unspecified body region, initial encounter: Secondary | ICD-10-CM

## 2018-01-02 DIAGNOSIS — S4991XA Unspecified injury of right shoulder and upper arm, initial encounter: Secondary | ICD-10-CM | POA: Diagnosis not present
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
## 2018-01-03 ENCOUNTER — Emergency Department (HOSPITAL_COMMUNITY): Payer: BLUE CROSS/BLUE SHIELD

## 2018-01-03 DIAGNOSIS — M542 Cervicalgia: Secondary | ICD-10-CM | POA: Diagnosis not present

## 2018-01-03 DIAGNOSIS — S161XXA Strain of muscle, fascia and tendon at neck level, initial encounter: Secondary | ICD-10-CM | POA: Diagnosis not present

## 2018-01-03 DIAGNOSIS — M546 Pain in thoracic spine: Secondary | ICD-10-CM | POA: Diagnosis not present

## 2018-01-03 DIAGNOSIS — S4991XA Unspecified injury of right shoulder and upper arm, initial encounter: Secondary | ICD-10-CM | POA: Diagnosis not present

## 2018-01-03 DIAGNOSIS — S199XXA Unspecified injury of neck, initial encounter: Secondary | ICD-10-CM | POA: Diagnosis not present

## 2018-01-03 DIAGNOSIS — M545 Low back pain: Secondary | ICD-10-CM | POA: Diagnosis not present

## 2018-01-03 DIAGNOSIS — M25511 Pain in right shoulder: Secondary | ICD-10-CM | POA: Diagnosis not present

## 2018-01-03 DIAGNOSIS — M25531 Pain in right wrist: Secondary | ICD-10-CM | POA: Diagnosis not present

## 2018-01-03 MED ORDER — NAPROXEN 250 MG PO TABS
375.0000 mg | ORAL_TABLET | Freq: Once | ORAL | Status: AC
  Administered 2018-01-03: 375 mg via ORAL
  Filled 2018-01-03: qty 2

## 2018-01-03 MED ORDER — METHOCARBAMOL 500 MG PO TABS: 500.0000 mg | ORAL_TABLET | Freq: Two times a day (BID) | ORAL | 0 refills | Status: DC

## 2018-01-03 MED ORDER — METHOCARBAMOL 500 MG PO TABS
500.0000 mg | ORAL_TABLET | Freq: Once | ORAL | Status: AC
  Administered 2018-01-03: 500 mg via ORAL
  Filled 2018-01-03: qty 1

## 2018-01-03 NOTE — ED Notes (Signed)
Pt departed in NAD, refused use of wheelchair.  
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
## 2018-01-03 NOTE — ED Notes (Signed)
Patient transported to X-ray 

## 2018-01-03 NOTE — ED Notes (Signed)
ED Provider at bedside. 

## 2018-01-03 NOTE — Discharge Instructions (Signed)

## 2018-01-13 ENCOUNTER — Telehealth: Payer: Self-pay | Admitting: Internal Medicine

## 2018-01-13 NOTE — Telephone Encounter (Signed)
Rec'd call requesting another request to now try the [facility] instead of [facility] as the patient is not happy.

## 2018-01-14 ENCOUNTER — Other Ambulatory Visit: Payer: Self-pay | Admitting: Internal Medicine

## 2018-01-14 DIAGNOSIS — F319 Bipolar disorder, unspecified: Secondary | ICD-10-CM

## 2018-01-16 DIAGNOSIS — M2578 Osteophyte, vertebrae: Secondary | ICD-10-CM | POA: Diagnosis not present

## 2018-01-16 DIAGNOSIS — M25411 Effusion, right shoulder: Secondary | ICD-10-CM | POA: Diagnosis not present

## 2018-01-16 DIAGNOSIS — R600 Localized edema: Secondary | ICD-10-CM | POA: Diagnosis not present

## 2018-01-16 DIAGNOSIS — M50222 Other cervical disc displacement at C5-C6 level: Secondary | ICD-10-CM | POA: Diagnosis not present

## 2018-01-16 DIAGNOSIS — M7581 Other shoulder lesions, right shoulder: Secondary | ICD-10-CM | POA: Diagnosis not present

## 2018-01-16 DIAGNOSIS — M24111 Other articular cartilage disorders, right shoulder: Secondary | ICD-10-CM | POA: Diagnosis not present

## 2018-02-07 DIAGNOSIS — M5020 Other cervical disc displacement, unspecified cervical region: Secondary | ICD-10-CM | POA: Diagnosis not present

## 2018-05-06 ENCOUNTER — Emergency Department (HOSPITAL_COMMUNITY)
Admission: EM | Admit: 2018-05-06 | Discharge: 2018-05-06 | Disposition: A | Discharge status: Home / Self Care | Payer: BLUE CROSS/BLUE SHIELD | Attending: Emergency Medicine | Admitting: Emergency Medicine

## 2018-05-06 ENCOUNTER — Other Ambulatory Visit: Payer: Self-pay

## 2018-05-06 ENCOUNTER — Encounter: Payer: Self-pay | Admitting: *Deleted

## 2018-05-06 ENCOUNTER — Encounter (HOSPITAL_COMMUNITY): Payer: Self-pay | Admitting: Emergency Medicine

## 2018-05-06 DIAGNOSIS — M5432 Sciatica, left side: Secondary | ICD-10-CM

## 2018-05-06 DIAGNOSIS — M25511 Pain in right shoulder: Secondary | ICD-10-CM

## 2018-05-06 DIAGNOSIS — M62830 Muscle spasm of back: Secondary | ICD-10-CM

## 2018-05-06 DIAGNOSIS — Z87891 Personal history of nicotine dependence: Secondary | ICD-10-CM | POA: Insufficient documentation

## 2018-05-06 DIAGNOSIS — M5442 Lumbago with sciatica, left side: Secondary | ICD-10-CM | POA: Insufficient documentation

## 2018-05-06 DIAGNOSIS — G8929 Other chronic pain: Secondary | ICD-10-CM

## 2018-05-06 DIAGNOSIS — K0889 Other specified disorders of teeth and supporting structures: Secondary | ICD-10-CM

## 2018-05-06 MED ORDER — MELOXICAM 7.5 MG PO TABS: 7.5000 mg | ORAL_TABLET | Freq: Every day | ORAL | 2 refills | Status: DC

## 2018-05-06 MED ORDER — KETOROLAC TROMETHAMINE 60 MG/2ML IM SOLN
30.0000 mg | Freq: Once | INTRAMUSCULAR | Status: AC
  Administered 2018-05-06: 30 mg via INTRAMUSCULAR
  Filled 2018-05-06: qty 2

## 2018-05-06 MED ORDER — METHOCARBAMOL 500 MG PO TABS: 500.0000 mg | ORAL_TABLET | Freq: Two times a day (BID) | ORAL | 0 refills | Status: DC

## 2018-05-06 NOTE — ED Notes (Signed)
Provider spoke with patient ready for discharge.

## 2018-05-06 NOTE — ED Triage Notes (Signed)
Pt. Stated, Ive had back pain for a week. Lower back.

## 2018-05-06 NOTE — ED Notes (Signed)
Patient wanted to talk with provider again regarding dental pain.

## 2018-05-06 NOTE — Discharge Instructions (Addendum)
Take Robaxin and Mobic as prescribed.  Follow-up with your doctor for further evaluation and possible physical therapy referral.  Referral for primary care was given if needed.  Apply warm compresses to sore muscles for 20 minutes at a time.

## 2018-05-06 NOTE — ED Notes (Signed)
Sign pad not working verbalized understanding of discharge instructions.  

## 2018-05-06 NOTE — ED Provider Notes (Addendum)
[facility]ENC[facility] Provider Note   CSN: [id number] Arrival date & time: 05/06/18  [id number]     History   Chief Complaint Chief Complaint  Patient presents with  . Back Pain    HPI Paul Jarvis is a 27 y.o. male.  27 year old male presents with complaint of left lower back pain which radiates down his left leg.  Pain started a week ago, has been taking ibuprofen which has helped until today when the ibuprofen did not help with his pain.  Denies falls or injuries.  Patient was playing the drums in [place] when this started, denies heavy lifting or carrying, states he was sitting at sometimes and standing others.  He denies abdominal pain, changes in bowel or bladder habits, groin numbness.  States he has had back discomfort off and on in the past but not recently until this episode started.  No other complaints or concerns.     Past Medical History:  Diagnosis Date  . Anxiety   . Depression   . Migraine     Patient Active Problem List   Diagnosis Date Noted  . Back pain 11/14/2017  . Muscle tightness 11/14/2017  . Lateral epicondylitis (tennis elbow) 06/21/2016  . Trapezius muscle spasm 06/21/2016  . Right shoulder pain 11/17/2015  . Bipolar depression (HCC) 11/17/2015  . Healthcare maintenance 11/17/2015    Past Surgical History:  Procedure Laterality Date  . APPENDECTOMY          Home Medications    Prior to Admission medications   Medication Sig Start Date End Date Taking? Authorizing Provider  meloxicam (MOBIC) 7.5 MG tablet Take 1 tablet (7.5 mg total) by mouth daily. 05/06/18   Jeannie FendMurphy, Laura A, PA-C  methocarbamol (ROBAXIN) 500 MG tablet Take 1 tablet (500 mg total) by mouth 2 (two) times daily. 05/06/18   Jeannie FendMurphy, Laura A, PA-C    Family History No family history on file.  Social History Social History   Tobacco Use  . Smoking status: Former Smoker    Last attempt to quit: 10/16/1999    Years since quitting: 18.5  .  Smokeless tobacco: Never Used  Substance Use Topics  . Alcohol use: No    Alcohol/week: 0.0 oz    Comment: occ  . Drug use: No    Comment: previous use of marijuana     Allergies   Patient has no known allergies.   Review of Systems Review of Systems  Constitutional: Negative for fever.  Gastrointestinal: Negative for abdominal pain.  Genitourinary: Negative for difficulty urinating.  Musculoskeletal: Positive for back pain.  Skin: Negative for rash and wound.  Allergic/Immunologic: Negative for immunocompromised state.  Neurological: Negative for weakness and numbness.  Hematological: Does not bruise/bleed easily.  Psychiatric/Behavioral: Negative for confusion.  All other systems reviewed and are negative.    Physical Exam Updated Vital Signs BP 126/60   Pulse 64   Temp 98 F (36.7 C) (Oral)   Resp 16   Ht 5\' 9"  (1.753 m)   Wt 108.9 kg (240 lb)   SpO2 99%   BMI 35.44 kg/m   Physical Exam  Constitutional: He is oriented to person, place, and time. He appears well-developed and well-nourished. No distress.  HENT:  Head: Normocephalic and atraumatic.  Eyes: Conjunctivae are normal.  Cardiovascular: Intact distal pulses.  Pulmonary/Chest: Effort normal.  Musculoskeletal: He exhibits tenderness. He exhibits no deformity.       Lumbar back: He exhibits tenderness and spasm. He exhibits no  bony tenderness, no swelling, no edema, no deformity and no laceration.       Back:  Neurological: He is alert and oriented to person, place, and time. He has normal strength. He displays normal reflexes. No sensory deficit.  Skin: Skin is warm and dry. Capillary refill takes less than 2 seconds. No rash noted. He is not diaphoretic.  Psychiatric: He has a normal mood and affect. His behavior is normal.  Nursing note and vitals reviewed.    ED Treatments / Results  Labs (all labs ordered are listed, but only abnormal results are displayed) Labs Reviewed - No data to  display  EKG None  Radiology No results found.  Procedures Procedures (including critical care time)  Medications Ordered in ED Medications  ketorolac (TORADOL) injection 30 mg (30 mg Intramuscular Given 05/06/18 1035)     Initial Impression / Assessment and Plan / ED Course  I have reviewed the triage vital signs and the nursing notes.  Pertinent labs & imaging results that were available during my care of the patient were reviewed by me and considered in my medical decision making (see chart for details).  Clinical Course as of May 07 1107  Tue May 06, 2018  [id number] 27 year old male presents with complaint of left low back pain x1 week without fall or injury.  No red symptoms.  On exam he has some limit in forward flexion otherwise range of motion is good, reflexes and leg strength are symmetric.  Tender left lower back extending to left SI joint.  Discussed sciatica and muscle spasm with patient.  Patient states that his uncle recently died from a blood clot in the leg and could his symptoms to be due to a blood clot in the leg.  Reviewed normal pulses in the legs as well as no calf swelling or tenderness, no palpable cords, distribution of his pain more consistent with radicular pain from his sciatica.  Advised patient he should follow-up with his PCP or see orthopedics for further evaluation and physical therapy.  Imaging not done as there was no traumatic injury and he has no bony tenderness.  Patient is able to move his left hip and reproduce a left hip click which does not cause him pain.  Again advised patient to see PCP or orthopedics for physical therapy.  Patient was given Toradol while in the ER for his pain, discharged home with meloxicam and Robaxin.  Referral given for PCP if he does not have one for follow-up.   [LM]  1107 At time of discharge patient complained to nurse of having right upper dental pain he wanted to be seen for as well today.  Patient states that a week ago he  had swelling due to her right upper tooth problem which he treated with coconut oil and clove oil and the swelling completely resolved however he has a bump that he wants looked at.  On exam patient has multiple fillings to the right upper teeth without obvious dental decay or caries, no obvious abscess at this time, the gingiva are normal soft tissue normal.  Advised patient he is to see a dentist where they can do dental x-rays to further evaluate for infection which may be between the teeth or not visible and requiring x-ray.  Patient was given dental resource list.   [LM]    Clinical Course User Index [LM] Jeannie Fend, PA-C    Final Clinical Impressions(s) / ED Diagnoses   Final diagnoses:  Muscle spasm  of back  Sciatica of left side  Pain, dental    ED Discharge Orders        Ordered    meloxicam (MOBIC) 7.5 MG tablet  Daily     05/06/18 1018    methocarbamol (ROBAXIN) 500 MG tablet  2 times daily     05/06/18 1018       Jeannie Fend, PA-C 05/06/18 1026    Jeannie Fend, PA-C 05/06/18 1108    Cathren Laine, MD 05/06/18 1255

## 2018-07-14 ENCOUNTER — Other Ambulatory Visit: Payer: Self-pay

## 2018-07-14 ENCOUNTER — Ambulatory Visit: Payer: BLUE CROSS/BLUE SHIELD | Admitting: Internal Medicine

## 2018-07-14 VITALS — BP 162/91 | HR 100 | Temp 98.6°F | Wt 230.7 lb

## 2018-07-14 DIAGNOSIS — G8929 Other chronic pain: Secondary | ICD-10-CM

## 2018-07-14 DIAGNOSIS — M79605 Pain in left leg: Secondary | ICD-10-CM | POA: Diagnosis not present

## 2018-07-14 DIAGNOSIS — M19011 Primary osteoarthritis, right shoulder: Secondary | ICD-10-CM

## 2018-07-14 DIAGNOSIS — R531 Weakness: Secondary | ICD-10-CM

## 2018-07-14 DIAGNOSIS — M549 Dorsalgia, unspecified: Secondary | ICD-10-CM | POA: Diagnosis not present

## 2018-07-14 DIAGNOSIS — M25511 Pain in right shoulder: Secondary | ICD-10-CM

## 2018-07-14 DIAGNOSIS — F319 Bipolar disorder, unspecified: Secondary | ICD-10-CM

## 2018-07-14 NOTE — Assessment & Plan Note (Signed)
Assessment: Paul Jarvis left leg pain which begins in his buttocks and radiates downwards is consistent with sciatica. This however would not account for his progressive weakness. His physical exam was unremarkable today but I do believe that given his progressive weakness, it is reasonable to refer to neurology for further evaluation. I also believe that Paul Jarvis would benefit greatly from PT.   Plan: 1. Refer to neurology 2. Refer to PT

## 2018-07-14 NOTE — Assessment & Plan Note (Signed)
Assessment: Paul Jarvis continues to have right shoulder pain and right arm weakness of unclear etiology. His last shoulder MRI in March 2019 showed signes of mild AC osteoarthritis but no injury to brachial plexus or ligament tear. I will refer to neurology for further work-up and Pt for symptoms management.  Plan: 1. Refer to neurology 2. Refer to PT

## 2018-07-14 NOTE — Progress Notes (Signed)
   CC: "neurology referral"  HPI:  [name] is a 27 y.o. male with bipolar and back/shoulder pain who presents for "neurology referral".  He reports that he has had chronic right upper extremity pain and weakness which has been slowly progressing since 2012. He describes the pain as beginning in his neck and radiating downwards into his right arm and up into the occiput area of his head. He also has had left lower extremity pain as well. He describes this pain as beginning in his left buttocks and radiating down towards his left thigh. He again has weakness to this left leg. He has been seen in our clinic for shoulder pain, muscle tightness, and back pain over the last year. Large work-up for rheumatologic diseases revealed unremarkable ANA, ant-CCP, aldolase, ESR, CRP and CK. He was referred to sports medicine and psych at his last clinic visit. He has been seeing a local sports medicine doctor which has been gave him "a head apparatus to stretch his head" which the patient has not used. He did not follow up with Psych. He was seen in the ED for muscle spasms of his ack and given Toradol and discharged home with Meloxicam and Robaxin. He does state that these medications have helped his symptoms somewhat.   12/24/17: Right shoulder MRI showed progressed mild acromioclavicular osteoarthritis with new prominent marrow edema about the joint, suggestive of acute degenerative inflammation. 01/03/18: Cervical/Thoracic/Lumbar Spine X-ray unremarkable  Past Medical History:  Diagnosis Date  . Anxiety   . Depression   . Migraine    Review of Systems:   Review of Systems  Constitutional: Negative for chills, fever and weight loss.  HENT: Negative for congestion and sore throat.   Respiratory: Negative for cough and shortness of breath.   Cardiovascular: Negative for chest pain and palpitations.  Gastrointestinal: Negative for abdominal pain, constipation, diarrhea, nausea and vomiting.    Genitourinary: Negative for dysuria and hematuria.  Musculoskeletal:       Right arm and left leg pain  Neurological: Negative for dizziness and headaches.       Right arm and left leg weakness.  All other systems reviewed and are negative.   Physical Exam:  Vitals:   07/14/18 1050  BP: (!) 162/91  Pulse: 100  Temp: 98.6 F (37 C)  TempSrc: Oral  SpO2: 100%  Weight: 230 lb 11.2 oz (104.6 kg)   Physical Exam  Constitutional: He appears well-developed and well-nourished.  HENT:  Head: Normocephalic and atraumatic.  Eyes: EOM are normal.  Neck: Normal range of motion.  Cardiovascular: Normal rate, regular rhythm and normal heart sounds.  Pulmonary/Chest: Effort normal and breath sounds normal.  Abdominal: Soft. Bowel sounds are normal.  Musculoskeletal: Normal range of motion. He exhibits no edema or tenderness.  Neurological: He is alert. He has normal strength. He displays normal reflexes. No cranial nerve deficit or sensory deficit.  Skin: Skin is warm and dry.  Psychiatric: He has a normal mood and affect. His behavior is normal.    Assessment & Plan:   See Encounters Tab for problem based charting.  Patient seen with Dr. Dareen Piano

## 2018-07-15 ENCOUNTER — Telehealth: Payer: Self-pay | Admitting: *Deleted

## 2018-07-15 NOTE — Telephone Encounter (Signed)
WAITING CALL BACK AS TO WHERE HE WANTS TO GO.

## 2018-07-18 NOTE — Progress Notes (Signed)
Internal Medicine Clinic Attending  I saw and evaluated the patient.  I personally confirmed the key portions of the history and exam documented by Dr. Prince and I reviewed pertinent patient test results.  The assessment, diagnosis, and plan were formulated together and I agree with the documentation in the resident's note.  

## 2018-09-09 ENCOUNTER — Other Ambulatory Visit: Payer: Self-pay

## 2018-09-09 DIAGNOSIS — G8929 Other chronic pain: Secondary | ICD-10-CM

## 2018-09-09 DIAGNOSIS — M25511 Pain in right shoulder: Principal | ICD-10-CM

## 2018-09-09 NOTE — Telephone Encounter (Signed)
Needs to speak with a nurse about med. Please call pt back.  

## 2018-09-09 NOTE — Telephone Encounter (Signed)
Return call to pt's wife -stated Medicaid has expires but is covered by [employer]. But pt's previous psych doctor will no longer see him which he is prescribed Wellbutrin and Abilify.  He will need refills until he is establish some where else. She wanted to know if his PCP will be able to refill these meds?  I asked if his prev doctor would refill meds until he find another doctor or seen here; stated she's not sure. 1st available [facility] scheduled by front [facility]. Also requesting refill on Meloxicam. Thanks

## 2018-09-10 ENCOUNTER — Other Ambulatory Visit: Payer: Self-pay | Admitting: Internal Medicine

## 2018-09-10 DIAGNOSIS — F319 Bipolar disorder, unspecified: Secondary | ICD-10-CM

## 2018-09-10 MED ORDER — BUPROPION HCL ER (XL) 150 MG PO TB24: 150.0000 mg | ORAL_TABLET | Freq: Every day | ORAL | 0 refills | Status: DC

## 2018-09-10 MED ORDER — ARIPIPRAZOLE 5 MG PO TABS: 5.0000 mg | ORAL_TABLET | Freq: Every day | ORAL | 0 refills | Status: DC

## 2018-09-10 MED ORDER — MELOXICAM 7.5 MG PO TABS: 7.5000 mg | ORAL_TABLET | Freq: Every day | ORAL | 2 refills | Status: DC

## 2018-09-10 NOTE — Progress Notes (Signed)
Mr. Paul Jarvis insurance has [facility] from [facility] to [employer]. His psychiatrist who normally prescribes his Wellbutrin and Abilify can no longer see him. He is completely out of these 2 medications. I will prescribe him a one month supply of Wellbutrin 150 mg QD #30 and Abilify 5 mg QD #30 which will suffice until he can establish care with a new psychiatrist.

## 2018-09-10 NOTE — Telephone Encounter (Signed)
I have not seen this patient before, and I will be unable to assist with the Rx request.  However, the patient will be in the office on 09/15/18, and I will be happy to evaluate the patient while in the office.

## 2018-09-10 NOTE — Telephone Encounter (Signed)
I will prescribe his Meloxicam at this time. I personally have not evaluated his mental health issues given that they were being managed by his psychiatrist. Does he have enough pills of his Wellbutrin and Abilify to [facility]? If yes, then I would feel more comfortable having someone in our clinic evaluate him prior to prescribing medications. If he has completely run out, I can prescribe a 1 month supply until he establishes with a new psychiatrist.   Will you please call him to clarify? Thank you!

## 2018-09-10 NOTE — Telephone Encounter (Signed)
Pt stated Abilify 5 mg once a day at bedtime; Wellbutrin 150 mg daily.

## 2018-09-10 NOTE — Telephone Encounter (Signed)
Glenda--Thank you for looking into this.  I will prescribe Abilify 5 mg QD #30 and Wellbutrin 150 mg QD #30. This 1 month supply should suffice until he is able to establish care with a new psychiatrist.

## 2018-09-10 NOTE — Telephone Encounter (Signed)
Pt's wife stated pt is completely out of medication; she will call back with doses of Abilify and Wellbutrin. Also informed her, Dr Criss AlvinePrince will refill Meloxicam. And stated [facility].

## 2018-09-15 ENCOUNTER — Ambulatory Visit: Payer: BLUE CROSS/BLUE SHIELD | Admitting: Internal Medicine

## 2018-09-15 ENCOUNTER — Encounter: Payer: Self-pay | Admitting: Internal Medicine

## 2018-09-15 ENCOUNTER — Other Ambulatory Visit: Payer: Self-pay

## 2018-09-15 ENCOUNTER — Telehealth: Payer: Self-pay | Admitting: *Deleted

## 2018-09-15 ENCOUNTER — Ambulatory Visit: Payer: Medicaid Other | Admitting: Neurology

## 2018-09-15 VITALS — BP 134/64 | HR 55 | Temp 98.1°F | Ht 69.5 in | Wt 238.1 lb

## 2018-09-15 DIAGNOSIS — M2550 Pain in unspecified joint: Secondary | ICD-10-CM

## 2018-09-15 DIAGNOSIS — Z79899 Other long term (current) drug therapy: Secondary | ICD-10-CM | POA: Diagnosis not present

## 2018-09-15 DIAGNOSIS — F319 Bipolar disorder, unspecified: Secondary | ICD-10-CM

## 2018-09-15 MED ORDER — MELOXICAM 7.5 MG PO TABS: 7.5000 mg | ORAL_TABLET | Freq: Every day | ORAL | 2 refills | Status: DC

## 2018-09-15 NOTE — Progress Notes (Signed)
   CC: psychiatry referral for bipolar disorder  HPI:  [name] is a 27 y.o.  Male with history noted below that presents to the acute care clinic for psychiatry referral. Please see problem based charting for the status of patient's chronic medical conditions.  Past Medical History:  Diagnosis Date  . Anxiety   . Depression   . Migraine     Review of Systems:  Review of Systems  Respiratory: Negative for shortness of breath.   Cardiovascular: Negative for chest pain.  Gastrointestinal: Negative for abdominal pain.  Musculoskeletal: Positive for joint pain.  Psychiatric/Behavioral: Positive for depression. Negative for suicidal ideas.     Physical Exam:  Vitals:   09/15/18 1023 09/15/18 1058  BP: (!) 147/79 134/64  Pulse: (!) 57 (!) 55  Temp: 98.1 F (36.7 C)   TempSrc: Oral   SpO2: 100%   Weight: 238 lb 1.6 oz (108 kg)   Height: 5' 9.5" (1.765 m)    Physical Exam  Constitutional: He is well-developed, well-nourished, and in no distress.  Cardiovascular: Normal rate, regular rhythm and normal heart sounds. Exam reveals no gallop and no friction rub.  No murmur heard. Pulmonary/Chest: Effort normal and breath sounds normal. No respiratory distress. He has no wheezes. He has no rales.     Assessment & Plan:   See encounters tab for problem based medical decision making.    Patient discussed with Dr. Heide SparkNarendra

## 2018-09-15 NOTE — Patient Instructions (Signed)
Mr. Paul Jarvis,  It was a pleasure meeting you today. A psychiatry referral has been made.

## 2018-09-15 NOTE — Telephone Encounter (Signed)
No showed new patient appointment. 

## 2018-09-15 NOTE — Telephone Encounter (Signed)
Patient requested to arrive at 8:30am to check-in for his 9am new patient appt.  He arrived to our office at 9:12am.  He was rescheduled.

## 2018-09-15 NOTE — Addendum Note (Signed)
Addended by: Fredderick SeveranceUCATTE, Donelda Mailhot L on: 09/15/2018 09:24 AM   Modules accepted: Orders

## 2018-09-15 NOTE — Assessment & Plan Note (Signed)
Assessment: History of bipolar depression Patient presents today asking for referral to psychiatry.  He is currently adherent to Abilify and Wellbutrin.  His insurance changed and his current psychiatrist is no longer in his network.  His Phq-9 was 27 today.  He denies current suicidal ideation or plan to kill himself.  Plan -Psychiatry referral -Behavioral Health [facility]medicine [facility]

## 2018-09-16 ENCOUNTER — Encounter: Payer: Self-pay | Admitting: Neurology

## 2018-09-17 ENCOUNTER — Encounter: Payer: Self-pay | Admitting: Internal Medicine

## 2018-09-17 ENCOUNTER — Other Ambulatory Visit: Payer: Self-pay | Admitting: Internal Medicine

## 2018-09-17 DIAGNOSIS — F319 Bipolar disorder, unspecified: Secondary | ICD-10-CM

## 2018-09-17 NOTE — Addendum Note (Signed)
Addended by: Neomia DearPOWERS, Dim Meisinger E on: 09/17/2018 05:25 PM   Modules accepted: Orders

## 2018-09-17 NOTE — Progress Notes (Signed)
Internal Medicine Clinic Attending  Case discussed with Dr. Hoffman at the time of the visit.  We reviewed the resident's history and exam and pertinent patient test results.  I agree with the assessment, diagnosis, and plan of care documented in the resident's note.  

## 2018-09-18 ENCOUNTER — Encounter: Payer: Self-pay | Admitting: Internal Medicine

## 2018-09-18 ENCOUNTER — Ambulatory Visit: Payer: BLUE CROSS/BLUE SHIELD | Admitting: Internal Medicine

## 2018-09-18 ENCOUNTER — Other Ambulatory Visit: Payer: Self-pay | Admitting: Internal Medicine

## 2018-09-18 ENCOUNTER — Other Ambulatory Visit: Payer: Self-pay

## 2018-09-18 VITALS — BP 155/89 | HR 66 | Temp 98.4°F | Ht 69.0 in | Wt 241.9 lb

## 2018-09-18 DIAGNOSIS — M545 Low back pain, unspecified: Secondary | ICD-10-CM

## 2018-09-18 DIAGNOSIS — Z791 Long term (current) use of non-steroidal anti-inflammatories (NSAID): Secondary | ICD-10-CM | POA: Diagnosis not present

## 2018-09-18 DIAGNOSIS — M62838 Other muscle spasm: Secondary | ICD-10-CM | POA: Diagnosis not present

## 2018-09-18 DIAGNOSIS — M25511 Pain in right shoulder: Secondary | ICD-10-CM

## 2018-09-18 DIAGNOSIS — G8929 Other chronic pain: Secondary | ICD-10-CM | POA: Diagnosis not present

## 2018-09-18 DIAGNOSIS — Z79899 Other long term (current) drug therapy: Secondary | ICD-10-CM

## 2018-09-18 MED ORDER — MELOXICAM 7.5 MG PO TABS: 7.5000 mg | ORAL_TABLET | Freq: Every day | ORAL | 2 refills | Status: AC

## 2018-09-18 NOTE — Patient Instructions (Addendum)
Thank you for allowing us to care for you  For your chronic pain - Continue Meloxicam and Robaxin - We will update your neurology referral for you to be seen - We will refer you again to PT  For the acute pain in your neck - Try intermittent heat on the neck and continue with muscle relaxant  Remember to request your records from the [facility] imaging center you mentioned  Please follow up with your PCP in the next 3 months, once you have been seen by neurology, or sooner if needed.

## 2018-09-18 NOTE — Addendum Note (Signed)
Addended by: Synetta ShadowPRINCE, JAMIE M on: 09/18/2018 05:41 PM   Modules accepted: Orders

## 2018-09-18 NOTE — Progress Notes (Signed)
   CC: Shoulder Pain, Back pain  HPI:  [name] is a 27 y.o. M with PMHx listed below presenting for Shoulder Pain, Back pain. Please see the A&P for the status of the patient's chronic medical problems.  Past Medical History:  Diagnosis Date  . Anxiety   . Depression   . Migraine    Review of Systems:  Performed and all others negative.  Physical Exam:  Vitals:   09/18/18 1436  BP: (!) 155/89  Pulse: 66  Temp: 98.4 F (36.9 C)  TempSrc: Oral  SpO2: 99%  Weight: 241 lb 14.4 oz (109.7 kg)  Height: 5\' 9"  (1.753 m)   Physical Exam  Constitutional: He appears well-developed and well-nourished.  Cardiovascular: Normal rate, regular rhythm, normal heart sounds and intact distal pulses.  Pulmonary/Chest: Effort normal and breath sounds normal. No respiratory distress.  Abdominal: Soft. Bowel sounds are normal. He exhibits no distension. There is no tenderness.  Musculoskeletal: Normal range of motion. He exhibits no edema or deformity.  Tenderness to palpation of R AC joint Mild tenderness to palpation of right cervical paraspinal musculature  Neurological:  Strength and sensation grossly intact bilateral UEs  Skin: Skin is dry.    Assessment & Plan:   See Encounters Tab for problem based charting.  Patient discussed with Dr. Heide SparkNarendra

## 2018-09-19 ENCOUNTER — Encounter: Payer: Self-pay | Admitting: Internal Medicine

## 2018-09-19 ENCOUNTER — Other Ambulatory Visit: Payer: Self-pay | Admitting: Internal Medicine

## 2018-09-19 MED ORDER — METHOCARBAMOL 500 MG PO TABS: 500.0000 mg | ORAL_TABLET | Freq: Two times a day (BID) | ORAL | 0 refills | Status: DC
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
## 2018-09-19 NOTE — Telephone Encounter (Signed)
[facility] [address]; pt contact [phone]  Pls call pt wife [phone]

## 2018-09-19 NOTE — Assessment & Plan Note (Signed)
Patient appears to have recurrent of muscle spasm in his upper right trapezius follow a night of sleeping at and odd angle. Recommended patient to apply heat and perform gentle stretching exercises. He also has a referral to PT if his discomfort has not resolved by that time they can provide further instruction.

## 2018-09-19 NOTE — Progress Notes (Signed)
Internal Medicine Clinic Attending  Case discussed with Dr. Melvin  at the time of the visit.  We reviewed the resident's history and exam and pertinent patient test results.  I agree with the assessment, diagnosis, and plan of care documented in the resident's note.  

## 2018-09-23 ENCOUNTER — Telehealth: Payer: Self-pay | Admitting: Licensed Clinical Social Worker

## 2018-09-23 ENCOUNTER — Telehealth: Payer: Self-pay

## 2018-09-23 NOTE — Telephone Encounter (Signed)
Pt called back to speak with you

## 2018-09-23 NOTE — Telephone Encounter (Signed)
Patient was contacted to discuss the referral for psychiatry. Patient did not answer, and a voicemail was left for the patient.

## 2018-09-25 ENCOUNTER — Other Ambulatory Visit: Payer: Self-pay | Admitting: Internal Medicine

## 2018-09-25 DIAGNOSIS — F319 Bipolar disorder, unspecified: Secondary | ICD-10-CM

## 2018-10-01 ENCOUNTER — Ambulatory Visit: Payer: No Typology Code available for payment source | Admitting: Physical Therapy

## 2018-10-02 ENCOUNTER — Telehealth: Payer: Self-pay | Admitting: Licensed Clinical Social Worker

## 2018-10-02 NOTE — Telephone Encounter (Signed)
Patient was contacted to follow up on the referral to [facility]. I shared with the patient that I spoke with [facility], and they should be contacting him within the next 24-48 hours to set up an initial appointment for psychiatry.

## 2018-10-24 ENCOUNTER — Other Ambulatory Visit: Payer: Self-pay | Admitting: Internal Medicine

## 2018-10-24 DIAGNOSIS — F319 Bipolar disorder, unspecified: Secondary | ICD-10-CM

## 2018-10-24 NOTE — Telephone Encounter (Signed)
Needs refill on methocarbamol (ROBAXIN) 500 MG tablet buPROPion (WELLBUTRIN XL) 150 MG 24 hr tablet(Expired) [facility] [phone] - [place], [place] - [address]   ;pt contact [phone]

## 2018-10-25 MED ORDER — METHOCARBAMOL 500 MG PO TABS: 500.0000 mg | ORAL_TABLET | Freq: Two times a day (BID) | ORAL | 0 refills | Status: DC

## 2018-10-25 MED ORDER — BUPROPION HCL ER (XL) 150 MG PO TB24: 150.0000 mg | ORAL_TABLET | Freq: Every day | ORAL | 0 refills | Status: DC

## 2018-10-25 NOTE — Telephone Encounter (Signed)
I will prescribe both medications for 1 month. I will NOT prescribe buproprion after this prescription. I have not personally evaluated him in clinic for depression. I initially prescribed Buproprion because he was unable to go to his regular psychiatrist to fill this medication. This prescription was to bridge him to a new mental health provider. He needs to either set up an appointment with me ASAP so that I can evaluate whether the buproprion is helping or he needs establish care with a mental health provider. Can someone please call him to let him know. Thank you.

## 2018-10-27 NOTE — Telephone Encounter (Signed)
Called pt to schedule an appt with his PCP - with the help of the front office, appt has been schedule for 2/12 @ 3545 PM with Dr Criss Alvine.

## 2018-10-28 ENCOUNTER — Telehealth: Payer: Self-pay | Admitting: Licensed Clinical Social Worker

## 2018-10-28 NOTE — Telephone Encounter (Signed)
Patient was contacted to follow on psychiatry referral. Patient did not answer. A voicemail was left.

## 2018-10-28 NOTE — Telephone Encounter (Signed)
Patient did not answer, and a voicemail was left to follow up on the referral to [facility].

## 2018-11-04 ENCOUNTER — Telehealth: Payer: Self-pay | Admitting: Licensed Clinical Social Worker

## 2018-11-04 NOTE — Telephone Encounter (Signed)
Patient was contacted to see if he has received his appointment from a referral from his PCP. Patient reported he has an upcoming appointment, but was unsure of the date. Patient was provided with the office's phone number so he could contact them to confirm the date of his upcoming appointment.

## 2018-11-04 NOTE — Telephone Encounter (Signed)
Patient was contacted to follow up on a referral. Patient reported he has an upcoming appointment, but was unsure of the date.

## 2018-11-10 ENCOUNTER — Other Ambulatory Visit: Payer: Self-pay

## 2018-11-10 ENCOUNTER — Ambulatory Visit (INDEPENDENT_AMBULATORY_CARE_PROVIDER_SITE_OTHER): Payer: BLUE CROSS/BLUE SHIELD | Admitting: Internal Medicine

## 2018-11-10 ENCOUNTER — Telehealth: Payer: Self-pay | Admitting: Internal Medicine

## 2018-11-10 VITALS — BP 140/91 | HR 77 | Temp 97.5°F | Ht 69.5 in | Wt 237.7 lb

## 2018-11-10 DIAGNOSIS — M25511 Pain in right shoulder: Secondary | ICD-10-CM | POA: Diagnosis not present

## 2018-11-10 DIAGNOSIS — F319 Bipolar disorder, unspecified: Secondary | ICD-10-CM | POA: Diagnosis not present

## 2018-11-10 DIAGNOSIS — Z79899 Other long term (current) drug therapy: Secondary | ICD-10-CM | POA: Diagnosis not present

## 2018-11-10 MED ORDER — IBUPROFEN 200 MG PO TABS: 800.0000 mg | ORAL_TABLET | Freq: Two times a day (BID) | ORAL | 0 refills | Status: AC | PRN

## 2018-11-10 MED ORDER — IBUPROFEN 200 MG PO TABS
800.0000 mg | ORAL_TABLET | Freq: Two times a day (BID) | ORAL | 0 refills | Status: DC | PRN
Start: 2018-11-10 — End: 2018-11-10

## 2018-11-10 NOTE — Assessment & Plan Note (Signed)
Patient reports that recent shoulder injury made her a little bit depressed.  Is taking bupropion not Abilify.  He  sometimes asks himself what is the reason that he is living. Discussing with him, he strongly denies any suicidal thoughts or ideation and is open to talk to our behavioral health counselor. His mood and affect are normal, his judgment and thought content are normal. Patient mentions that he has a psychiatry appointment. -Follow-up with psychiatry -Dilatory referral to behavioral health

## 2018-11-10 NOTE — Assessment & Plan Note (Signed)
Patient presented with new (acute on chronic) right arm/shoulder pain, after lifting heavy table 20 days ago. He reports 8 out of 10 constant throbbing pain on lateral side of his right shoulder radiating to his elbow. On exam, has swelling on shoulder and deltoid and triceps area.  Range of motion is full but painful particularly on shoulder flexion, internal and external rotation. Point-of-care ultrasound is suggestive of rotator cuff tendinitis.  Also shows (chronic) AC joint separation.  Rotator cuff tendinitis. -Ibuprofen 800 mg twice daily with meal as needed for 4 days -Return to clinic in 4 to 6 weeks if worsening -Recommended to keep gentle movement avoid frozen shoulder

## 2018-11-10 NOTE — Progress Notes (Signed)
   CC: Right shoulder pain  HPI:  [name] is a 28 y.o. male with past medical history listed below, presented with right shoulder/arm pain for past 20 days that occurred after he lifted heavy table.  Please refer to problem based charting for further details and assessment and plan.  Past Medical History:  Diagnosis Date  . Anxiety   . Depression   . Migraine    Review of Systems: Review of Systems  Constitutional: Negative for chills and fever.  Gastrointestinal: Negative for abdominal pain, nausea and vomiting.     Physical Exam:  Vitals:   11/10/18 1604  BP: (!) 140/91  Pulse: 77  Temp: (!) 97.5 F (36.4 C)  TempSrc: Oral  SpO2: 100%  Weight: 237 lb 11.2 oz (107.8 kg)  Height: 5' 9.5" (1.765 m)   Vital signs reviewed General: Pleasant gentleman sitting on the chair in no acute distress CV: RRR, normal S1-S2, no murmur Lungs: No respiratory distress, CTA bilaterally, no wheeze, no crackle Abdominal exam: Soft, nontender to palpation, BS are present Musculoskeletal: Swelling on right shoulder and right arm, range of motion is full but is tender on flexion, internal and external rotation, abduction is full and without tenderness. No lower extremity edema Pulses are palpable and symmetric bilaterally Neurologic exam: Patient is alert and oriented x3 Psychiatric exam: Patient has normal mood, affect and behavior, judgment is normal thought content is normal.  Assessment & Plan:   See Encounters Tab for problem based charting.  Patient seen with Dr. Oswaldo Done

## 2018-11-10 NOTE — Patient Instructions (Addendum)
Thank you for allowing [place] to provide your care today. Today we discussed your shoulder pain. Your physical exam and ultrasound in clinic is suggestive of right rotator cuff tendinitis. I prescribe Ibuprofen for you to be taken for 4 days with meal as instructed. Please avoid taking Meloxicame while you take Ibuprofen.    Please follow-up in 4-6 weeks if no improvement.   As we discussed, I also send a refferal for behavioral health expert, since you were not able to get your therapy session recently.  Should you have any questions or concerns please call the internal medicine clinic at [phone].   Thank you

## 2018-11-11 ENCOUNTER — Telehealth: Payer: Self-pay | Admitting: Licensed Clinical Social Worker

## 2018-11-11 NOTE — Telephone Encounter (Signed)
Patient was contacted to offer an appointment in our office. Patient reported that he would prefer to receive services in the same location as his medication. Patient has an appointment on 11/27/18 for psychiatry. Patient wanted some time to think about receiving services in our clinic, and will be contacted in several days to follow up.

## 2018-11-11 NOTE — Progress Notes (Signed)
Internal Medicine Clinic Attending  I saw and evaluated the patient.  I personally confirmed the key portions of the history and exam documented by Dr. Masoudi and I reviewed pertinent patient test results.  The assessment, diagnosis, and plan were formulated together and I agree with the documentation in the resident's note. 

## 2018-11-14 ENCOUNTER — Encounter

## 2018-11-14 ENCOUNTER — Encounter: Payer: Self-pay | Admitting: Diagnostic Neuroimaging

## 2018-11-14 ENCOUNTER — Ambulatory Visit (INDEPENDENT_AMBULATORY_CARE_PROVIDER_SITE_OTHER): Payer: BLUE CROSS/BLUE SHIELD | Admitting: Diagnostic Neuroimaging

## 2018-11-14 VITALS — BP 158/79 | HR 80 | Ht 69.5 in | Wt 240.0 lb

## 2018-11-14 DIAGNOSIS — R202 Paresthesia of skin: Secondary | ICD-10-CM

## 2018-11-14 DIAGNOSIS — M79605 Pain in left leg: Secondary | ICD-10-CM | POA: Diagnosis not present

## 2018-11-14 DIAGNOSIS — R200 Anesthesia of skin: Secondary | ICD-10-CM | POA: Diagnosis not present
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
## 2018-11-15 LAB — HEMOGLOBIN A1C
Est. average glucose Bld gHb Est-mCnc: 105 mg/dL
Hgb A1c MFr Bld: 5.3 % (ref 4.8–5.6)

## 2018-11-15 LAB — VITAMIN B12: Vitamin B-12: 490 pg/mL (ref 232–1245)

## 2018-11-15 LAB — TSH: TSH: 1.03 u[IU]/mL (ref 0.450–4.500)

## 2018-11-20 ENCOUNTER — Ambulatory Visit: Payer: BLUE CROSS/BLUE SHIELD | Attending: Internal Medicine | Admitting: Physical Therapy

## 2018-11-20 ENCOUNTER — Other Ambulatory Visit: Payer: Self-pay

## 2018-11-20 ENCOUNTER — Telehealth: Payer: Self-pay | Admitting: Physical Therapy

## 2018-11-20 ENCOUNTER — Encounter: Payer: Self-pay | Admitting: Physical Therapy

## 2018-11-20 DIAGNOSIS — M5442 Lumbago with sciatica, left side: Secondary | ICD-10-CM | POA: Diagnosis not present

## 2018-11-20 DIAGNOSIS — M6281 Muscle weakness (generalized): Secondary | ICD-10-CM | POA: Diagnosis not present

## 2018-11-20 DIAGNOSIS — M542 Cervicalgia: Secondary | ICD-10-CM

## 2018-11-20 DIAGNOSIS — G8929 Other chronic pain: Secondary | ICD-10-CM | POA: Diagnosis not present

## 2018-11-20 NOTE — Telephone Encounter (Signed)
Hello Dr. Criss AlvinePrince, Following my PT evaluation of Paul Jarvis, I feel he will also benefit from Occupational therapy for his RUE weakness, pain and impaired coordination.  If you agree, will you please enter an order for OT Eval and Treat into Epic?    Thanks, Dierdre HighmanAudra F Magaline Steinberg, PT, DPT 11/20/18    12:40 PM
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
## 2018-11-21 ENCOUNTER — Other Ambulatory Visit: Payer: Self-pay | Admitting: Internal Medicine

## 2018-11-21 DIAGNOSIS — M6289 Other specified disorders of muscle: Secondary | ICD-10-CM

## 2018-11-21 DIAGNOSIS — M7711 Lateral epicondylitis, right elbow: Secondary | ICD-10-CM

## 2018-11-21 DIAGNOSIS — M79605 Pain in left leg: Secondary | ICD-10-CM

## 2018-11-21 DIAGNOSIS — F319 Bipolar disorder, unspecified: Secondary | ICD-10-CM

## 2018-11-21 NOTE — Telephone Encounter (Signed)
Ms. Paul Jarvis,  Thank you for this information. I have placed the order for OT.  Best, Julian Hy

## 2018-11-24 ENCOUNTER — Encounter: Payer: Self-pay | Admitting: *Deleted

## 2018-11-26 ENCOUNTER — Ambulatory Visit: Payer: BLUE CROSS/BLUE SHIELD | Admitting: Internal Medicine

## 2018-11-26 ENCOUNTER — Other Ambulatory Visit: Payer: Self-pay

## 2018-11-26 ENCOUNTER — Encounter: Payer: Self-pay | Admitting: Internal Medicine

## 2018-11-26 VITALS — BP 132/71 | HR 94 | Temp 99.0°F | Ht 69.5 in | Wt 236.3 lb

## 2018-11-26 DIAGNOSIS — F319 Bipolar disorder, unspecified: Secondary | ICD-10-CM

## 2018-11-26 DIAGNOSIS — M62838 Other muscle spasm: Secondary | ICD-10-CM

## 2018-11-26 DIAGNOSIS — M25511 Pain in right shoulder: Secondary | ICD-10-CM

## 2018-11-26 DIAGNOSIS — F419 Anxiety disorder, unspecified: Secondary | ICD-10-CM | POA: Diagnosis not present

## 2018-11-26 DIAGNOSIS — M79601 Pain in right arm: Secondary | ICD-10-CM | POA: Diagnosis not present

## 2018-11-26 DIAGNOSIS — G8929 Other chronic pain: Secondary | ICD-10-CM | POA: Diagnosis not present

## 2018-11-26 MED ORDER — METHOCARBAMOL 500 MG PO TABS: 500.0000 mg | ORAL_TABLET | Freq: Two times a day (BID) | ORAL | 3 refills | Status: AC
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
## 2018-11-26 NOTE — Patient Instructions (Signed)
[email or web address]

## 2018-11-27 ENCOUNTER — Ambulatory Visit: Payer: BLUE CROSS/BLUE SHIELD | Admitting: Physical Therapy

## 2018-11-27 ENCOUNTER — Ambulatory Visit (HOSPITAL_COMMUNITY): Payer: Self-pay | Admitting: Psychiatry

## 2018-11-27 DIAGNOSIS — G8929 Other chronic pain: Secondary | ICD-10-CM

## 2018-11-27 DIAGNOSIS — M6281 Muscle weakness (generalized): Secondary | ICD-10-CM | POA: Diagnosis not present

## 2018-11-27 DIAGNOSIS — M542 Cervicalgia: Secondary | ICD-10-CM | POA: Diagnosis not present

## 2018-11-27 DIAGNOSIS — M5442 Lumbago with sciatica, left side: Secondary | ICD-10-CM

## 2018-11-27 NOTE — Assessment & Plan Note (Signed)
Assessment/plan: He continues to have right upper trapezius muscle spasms that are intermittent in nature.  He has been referred to PT/OT and has appointments in the next week.  He has used methocarbamol in the past as needed.  I will refill methocarbamol 500 mg twice daily PRN #20
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
## 2018-11-27 NOTE — Assessment & Plan Note (Addendum)
Assessment/plan: He self discontinued Wellbutrin in our papers all because he did not believe that the medications were working.  He denies current SI/HI.  PHQ 9 improved today to 13 from 23 1 month ago.  He has an appointment with his new psychiatrist tomorrow to establish care.  We will follow-up his mood at next clinic visit. I will hold off on adding any additional mood stabilizers until he sees psychiatry.

## 2018-11-28 NOTE — Addendum Note (Signed)
Addended by: Doneen Poisson D on: 11/28/2018 08:10 AM   Modules accepted: Level of Service

## 2018-11-28 NOTE — Progress Notes (Signed)
Patient ID: Paul Jarvis, male   DOB: 05-14-1991, 28 y.o.   MRN: [id number]  Case discussed with Dr. Criss Alvine at the time of the visit.  We reviewed the resident's history and exam and pertinent patient test results.  I agree with the assessment, diagnosis and plan of care documented in the resident's note.

## 2018-12-01 NOTE — Addendum Note (Signed)
Addended by: Neomia Dear on: 12/01/2018 07:35 PM   Modules accepted: Orders

## 2018-12-04 ENCOUNTER — Encounter: Payer: Self-pay | Admitting: Physical Therapy

## 2018-12-04 ENCOUNTER — Ambulatory Visit: Payer: BLUE CROSS/BLUE SHIELD | Admitting: Physical Therapy

## 2018-12-04 DIAGNOSIS — G8929 Other chronic pain: Secondary | ICD-10-CM

## 2018-12-04 DIAGNOSIS — M542 Cervicalgia: Secondary | ICD-10-CM | POA: Diagnosis not present

## 2018-12-04 DIAGNOSIS — M6281 Muscle weakness (generalized): Secondary | ICD-10-CM

## 2018-12-04 DIAGNOSIS — M5442 Lumbago with sciatica, left side: Secondary | ICD-10-CM | POA: Diagnosis not present
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
## 2018-12-11 ENCOUNTER — Ambulatory Visit: Payer: BLUE CROSS/BLUE SHIELD | Admitting: Physical Therapy

## 2018-12-11 DIAGNOSIS — M6281 Muscle weakness (generalized): Secondary | ICD-10-CM

## 2018-12-11 DIAGNOSIS — G8929 Other chronic pain: Secondary | ICD-10-CM

## 2018-12-11 DIAGNOSIS — M5442 Lumbago with sciatica, left side: Secondary | ICD-10-CM | POA: Diagnosis not present

## 2018-12-11 DIAGNOSIS — M542 Cervicalgia: Secondary | ICD-10-CM | POA: Diagnosis not present

## 2018-12-11 NOTE — Patient Instructions (Signed)
Access Code: [email or web address]  URL: [email or web address]  Date: 12/11/2018  Prepared by: Ivery Quale   Exercises  Supine Piriformis Stretch - 3 reps - 1 sets - 30 hold - 2x daily - 6x weekly  Supine Hamstring Stretch with Strap - 3 reps - 1 sets - 30 hold - 2x daily - 6x weekly  Prone Press Up - 10 reps - 2-3 sets - 2x daily - 6x weekly
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
## 2018-12-16 ENCOUNTER — Ambulatory Visit: Payer: BLUE CROSS/BLUE SHIELD | Attending: Internal Medicine | Admitting: Occupational Therapy

## 2018-12-16 DIAGNOSIS — M5442 Lumbago with sciatica, left side: Secondary | ICD-10-CM | POA: Insufficient documentation

## 2018-12-16 DIAGNOSIS — R278 Other lack of coordination: Secondary | ICD-10-CM | POA: Diagnosis not present

## 2018-12-16 DIAGNOSIS — G8929 Other chronic pain: Secondary | ICD-10-CM

## 2018-12-16 DIAGNOSIS — M6281 Muscle weakness (generalized): Secondary | ICD-10-CM | POA: Diagnosis not present

## 2018-12-16 DIAGNOSIS — M25511 Pain in right shoulder: Secondary | ICD-10-CM | POA: Insufficient documentation

## 2018-12-16 DIAGNOSIS — M542 Cervicalgia: Secondary | ICD-10-CM | POA: Insufficient documentation

## 2018-12-16 DIAGNOSIS — M79632 Pain in left forearm: Secondary | ICD-10-CM | POA: Diagnosis not present
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
## 2018-12-18 ENCOUNTER — Telehealth: Payer: Self-pay | Admitting: *Deleted

## 2018-12-18 ENCOUNTER — Encounter

## 2018-12-18 ENCOUNTER — Ambulatory Visit: Payer: BLUE CROSS/BLUE SHIELD | Admitting: Physical Therapy

## 2018-12-18 ENCOUNTER — Encounter: Payer: Self-pay | Admitting: Physical Therapy

## 2018-12-18 ENCOUNTER — Encounter (INDEPENDENT_AMBULATORY_CARE_PROVIDER_SITE_OTHER): Payer: BLUE CROSS/BLUE SHIELD | Admitting: Diagnostic Neuroimaging

## 2018-12-18 ENCOUNTER — Ambulatory Visit (INDEPENDENT_AMBULATORY_CARE_PROVIDER_SITE_OTHER): Payer: BLUE CROSS/BLUE SHIELD | Admitting: Diagnostic Neuroimaging

## 2018-12-18 DIAGNOSIS — M5442 Lumbago with sciatica, left side: Secondary | ICD-10-CM

## 2018-12-18 DIAGNOSIS — R200 Anesthesia of skin: Secondary | ICD-10-CM

## 2018-12-18 DIAGNOSIS — R202 Paresthesia of skin: Secondary | ICD-10-CM

## 2018-12-18 DIAGNOSIS — M6281 Muscle weakness (generalized): Secondary | ICD-10-CM | POA: Diagnosis not present

## 2018-12-18 DIAGNOSIS — Z0289 Encounter for other administrative examinations: Secondary | ICD-10-CM

## 2018-12-18 DIAGNOSIS — M25511 Pain in right shoulder: Secondary | ICD-10-CM | POA: Diagnosis not present

## 2018-12-18 DIAGNOSIS — G8929 Other chronic pain: Secondary | ICD-10-CM | POA: Diagnosis not present

## 2018-12-18 DIAGNOSIS — R278 Other lack of coordination: Secondary | ICD-10-CM | POA: Diagnosis not present

## 2018-12-18 DIAGNOSIS — M542 Cervicalgia: Secondary | ICD-10-CM

## 2018-12-18 DIAGNOSIS — M79605 Pain in left leg: Secondary | ICD-10-CM

## 2018-12-18 DIAGNOSIS — F3181 Bipolar II disorder: Secondary | ICD-10-CM | POA: Diagnosis not present

## 2018-12-18 DIAGNOSIS — M79632 Pain in left forearm: Secondary | ICD-10-CM | POA: Diagnosis not present

## 2018-12-18 DIAGNOSIS — F411 Generalized anxiety disorder: Secondary | ICD-10-CM | POA: Diagnosis not present

## 2018-12-18 NOTE — Telephone Encounter (Signed)
rec'd call from pt asking about sports medicine referral, this was discussed at visit 2/12. Sending to dr Criss Alvine and lela. Please call him and update him. [phone]
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
## 2018-12-19 NOTE — Telephone Encounter (Signed)
I sent a referral for sports medicine. Can you please look into this Lela and call Mr. Shatswell. Thank you.

## 2018-12-19 NOTE — Addendum Note (Signed)
Addended by: Synetta Shadow on: 12/19/2018 07:28 PM   Modules accepted: Orders

## 2018-12-23 ENCOUNTER — Ambulatory Visit: Payer: BLUE CROSS/BLUE SHIELD | Admitting: Physical Therapy

## 2018-12-23 DIAGNOSIS — M5442 Lumbago with sciatica, left side: Secondary | ICD-10-CM | POA: Diagnosis not present

## 2018-12-23 DIAGNOSIS — M79632 Pain in left forearm: Secondary | ICD-10-CM | POA: Diagnosis not present

## 2018-12-23 DIAGNOSIS — M25511 Pain in right shoulder: Secondary | ICD-10-CM | POA: Diagnosis not present

## 2018-12-23 DIAGNOSIS — M542 Cervicalgia: Secondary | ICD-10-CM | POA: Diagnosis not present

## 2018-12-23 DIAGNOSIS — G8929 Other chronic pain: Secondary | ICD-10-CM | POA: Diagnosis not present

## 2018-12-23 DIAGNOSIS — M6281 Muscle weakness (generalized): Secondary | ICD-10-CM

## 2018-12-23 DIAGNOSIS — R278 Other lack of coordination: Secondary | ICD-10-CM | POA: Diagnosis not present
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
## 2018-12-25 NOTE — Progress Notes (Signed)
EMG was normal. Will proceed with additional testing including MRI of cervical and lumbar spine to rule out other degenerative, compressive, autoimmune or inflammatory etiologies.  Orders Placed This Encounter  Procedures  . MR CERVICAL SPINE W WO CONTRAST  . MR Lumbar Spine W Wo Contrast   Suanne Marker, MD 12/25/2018, 4:22 PM Certified in Neurology, Neurophysiology and Neuroimaging  [facility] [address] [place], [place] [address] [phone]

## 2018-12-25 NOTE — Addendum Note (Signed)
Addended by: Joycelyn Schmid R on: 12/25/2018 04:24 PM   Modules accepted: Orders

## 2018-12-25 NOTE — Procedures (Signed)
[facility]  NCS (NERVE CONDUCTION STUDY) WITH EMG (ELECTROMYOGRAPHY) REPORT   STUDY DATE: 12/25/18 PATIENT NAME: Paul Jarvis DOB: 1991/04/07 MRN: [id number]  ORDERING CLINICIAN: Joycelyn Schmid, MD   TECHNOLOGIST: Durenda Age ELECTROMYOGRAPHER: Glenford Bayley. Mckinzee Spirito, MD  CLINICAL INFORMATION: 28 year old male with right arm and left leg numbness.  FINDINGS: NERVE CONDUCTION STUDY:  Right median, right ulnar, left peroneal and left tibial motor responses are normal.  Right median, right ulnar, left sural and left superficial peroneal sensory responses are normal.  Right ulnar and left tibial F wave latencies are normal.   NEEDLE ELECTROMYOGRAPHY:  Needle examination of right deltoid, biceps, triceps, flexor carpi radialis, first dorsal interosseous; and left vastus medialis, tibialis anterior, gastrocnemius muscles: is normal.   IMPRESSION:   This is a normal study.  No electrodiagnostic evidence of large fiber neuropathy at this time.     INTERPRETING PHYSICIAN:  Suanne Marker, MD Certified in Neurology, Neurophysiology and Neuroimaging  [facility] [address], [address] [place], [place] [address] [phone]  [facility]    Nerve / Sites Muscle Latency Ref. Amplitude Ref. Rel Amp Segments Distance Velocity Ref. Area    ms ms mV mV %  cm m/s m/s mVms  R Median - APB     Wrist APB 3.1 ?4.4 9.9 ?4.0 100 Wrist - APB 7   28.8     Upper arm APB 7.6  8.6  86.9 Upper arm - Wrist 25 56 ?49 27.8  R Ulnar - ADM     Wrist ADM 2.7 ?3.3 11.1 ?6.0 100 Wrist - ADM 7   32.6     B.Elbow ADM 7.0  8.2  74.1 B.Elbow - Wrist 23 54 ?49 29.0     A.Elbow ADM 9.0  7.7  93.7 A.Elbow - B.Elbow 10 51 ?49 27.3         A.Elbow - Wrist      L Peroneal - EDB     Ankle EDB 3.2 ?6.5 7.6 ?2.0 100 Ankle - EDB 9   20.2     Fib head EDB 10.4  6.3  82.9 Fib head - Ankle 34 47 ?44 15.7     Pop fossa EDB 12.6  5.9  93 Pop fossa - Fib head 10 47 ?44 20.2    Pop fossa - Ankle      L Tibial - AH     Ankle AH 3.4 ?5.8 6.5 ?4.0 100 Ankle - AH 9   19.2     Pop fossa AH 13.0  4.8  74.8 Pop fossa - Ankle 39 41 ?41 13.1             SNC    Nerve / Sites Rec. Site Peak Lat Ref.  Amp Ref. Segments Distance    ms ms V V  cm  L Sural - Ankle (Calf)     Calf Ankle 3.4 ?4.4 10 ?6 Calf - Ankle 14  L Superficial peroneal - Ankle     Lat leg Ankle 4.0 ?4.4 9 ?6 Lat leg - Ankle 14  R Median - Orthodromic (Dig II, Mid palm)     Dig II Wrist 3.1 ?3.4 10 ?10 Dig II - Wrist 13  R Ulnar - Orthodromic, (Dig V, Mid palm)     Dig V Wrist 2.6 ?3.1 5 ?5 Dig V - Wrist 40              F  Wave    Nerve F  Lat Ref.   ms ms  R Ulnar - ADM 28.1 ?32.0  L Tibial - AH 50.8 ?56.0         EMG full       EMG Summary Table    Spontaneous MUAP Recruitment  Muscle IA Fib PSW Fasc Other Amp Dur. Poly Pattern  L. Vastus medialis Normal None None None _______ Normal Normal Normal Normal  L. Tibialis anterior Normal None None None _______ Normal Normal Normal Normal  L. Gastrocnemius (Medial head) Normal None None None _______ Normal Normal Normal Normal  R. Deltoid Normal None None None _______ Normal Normal Normal Normal  R. Biceps brachii Normal None None None _______ Normal Normal Normal Normal  R. Triceps brachii Normal None None None _______ Normal Normal Normal Normal  R. Flexor carpi radialis Normal None None None _______ Normal Normal Normal Normal  R. First dorsal interosseous Normal None None None _______ Normal Normal Normal Normal

## 2018-12-29 ENCOUNTER — Telehealth: Payer: Self-pay | Admitting: Diagnostic Neuroimaging

## 2018-12-29 NOTE — Telephone Encounter (Signed)
BCBS Auth: [id number] (exp. 12/29/18 to 01/27/19) order sent to [facility]. They will reach out to the pt to schedule.

## 2018-12-31 ENCOUNTER — Ambulatory Visit: Payer: Self-pay | Admitting: Sports Medicine

## 2019-01-01 ENCOUNTER — Ambulatory Visit: Payer: BLUE CROSS/BLUE SHIELD | Admitting: Physical Therapy

## 2019-01-01 ENCOUNTER — Encounter: Payer: Self-pay | Admitting: Physical Therapy

## 2019-01-01 DIAGNOSIS — M79632 Pain in left forearm: Secondary | ICD-10-CM | POA: Diagnosis not present

## 2019-01-01 DIAGNOSIS — M5442 Lumbago with sciatica, left side: Secondary | ICD-10-CM | POA: Diagnosis not present

## 2019-01-01 DIAGNOSIS — R278 Other lack of coordination: Secondary | ICD-10-CM | POA: Diagnosis not present

## 2019-01-01 DIAGNOSIS — M6281 Muscle weakness (generalized): Secondary | ICD-10-CM

## 2019-01-01 DIAGNOSIS — M542 Cervicalgia: Secondary | ICD-10-CM | POA: Diagnosis not present

## 2019-01-01 DIAGNOSIS — M25511 Pain in right shoulder: Secondary | ICD-10-CM | POA: Diagnosis not present

## 2019-01-01 DIAGNOSIS — G8929 Other chronic pain: Secondary | ICD-10-CM | POA: Diagnosis not present
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
## 2019-01-04 ENCOUNTER — Other Ambulatory Visit: Payer: Self-pay

## 2019-01-04 ENCOUNTER — Ambulatory Visit
Admission: RE | Admit: 2019-01-04 | Discharge: 2019-01-04 | Disposition: A | Discharge status: Home / Self Care | Payer: BLUE CROSS/BLUE SHIELD | Source: Ambulatory Visit | Attending: Diagnostic Neuroimaging | Admitting: Diagnostic Neuroimaging

## 2019-01-04 ENCOUNTER — Other Ambulatory Visit: Payer: Self-pay | Admitting: Diagnostic Neuroimaging

## 2019-01-04 DIAGNOSIS — M79605 Pain in left leg: Secondary | ICD-10-CM

## 2019-01-04 DIAGNOSIS — R202 Paresthesia of skin: Principal | ICD-10-CM

## 2019-01-04 DIAGNOSIS — R200 Anesthesia of skin: Secondary | ICD-10-CM

## 2019-01-06 ENCOUNTER — Ambulatory Visit: Payer: BLUE CROSS/BLUE SHIELD | Admitting: Occupational Therapy

## 2019-01-06 ENCOUNTER — Ambulatory Visit (HOSPITAL_COMMUNITY): Payer: Medicaid Other | Admitting: Psychiatry

## 2019-01-06 ENCOUNTER — Ambulatory Visit: Payer: BLUE CROSS/BLUE SHIELD | Admitting: Physical Therapy

## 2019-01-07 ENCOUNTER — Ambulatory Visit: Payer: Self-pay | Admitting: Sports Medicine

## 2019-01-08 ENCOUNTER — Encounter: Payer: Self-pay | Admitting: Occupational Therapy

## 2019-01-12 ENCOUNTER — Ambulatory Visit: Payer: Self-pay | Admitting: Sports Medicine

## 2019-01-13 ENCOUNTER — Ambulatory Visit: Payer: Self-pay | Admitting: Physical Therapy

## 2019-01-13 ENCOUNTER — Encounter: Payer: Self-pay | Admitting: Occupational Therapy

## 2019-01-15 ENCOUNTER — Encounter: Payer: Self-pay | Admitting: Occupational Therapy

## 2019-01-15 DIAGNOSIS — F411 Generalized anxiety disorder: Secondary | ICD-10-CM | POA: Diagnosis not present

## 2019-01-15 DIAGNOSIS — F3181 Bipolar II disorder: Secondary | ICD-10-CM | POA: Diagnosis not present

## 2019-01-20 ENCOUNTER — Encounter: Payer: Self-pay | Admitting: Occupational Therapy

## 2019-01-21 ENCOUNTER — Telehealth: Payer: Self-pay | Admitting: Occupational Therapy

## 2019-01-21 NOTE — Telephone Encounter (Signed)
Pt was contacted today regarding temporary reduction of Outpatient Neuro Rehabilitation Services due to concerns for community transmission of COVID-19.  Patient identity was verified.    Assessed if patient needed to be seen in person by clinician (recent fall or acute injury that requires hands on assessment and advice, change in diet order, post-surgical, special cases, etc.).    Therapist advised the patient to continue to perform HEP and assured pt had no unanswered questions or concerns at this time.  The patient expressed interest in being contacted for an E-Visit, virtual check in, or Telehealth visit to continue their plan of care, when those services become available.  Outpatient Neuro Rehabilitation Services will follow up with patient at that time.  Patient is aware we can be reached by telephone during limited business hours in the meantime.  Willa Frater, OTR/L [facility] [address] [place], [place]  [address] [phone] phone [phone] 01/21/19 1:40 PM

## 2019-01-21 NOTE — Telephone Encounter (Signed)
Therapist left patient a message that if he is interested in scheduling a therapy visit next week to please call the clinic at [phone]. Keene Breath, OTR/L [phone] Phone: [phone] 3:59 PM 01/21/19

## 2019-01-22 ENCOUNTER — Encounter: Payer: Self-pay | Admitting: Occupational Therapy

## 2019-01-27 ENCOUNTER — Encounter: Payer: Self-pay | Admitting: Occupational Therapy

## 2019-01-30 ENCOUNTER — Ambulatory Visit: Payer: BLUE CROSS/BLUE SHIELD | Attending: Internal Medicine | Admitting: Occupational Therapy

## 2019-02-03 ENCOUNTER — Encounter: Payer: Self-pay | Admitting: Occupational Therapy

## 2019-02-10 ENCOUNTER — Encounter: Payer: Self-pay | Admitting: Occupational Therapy

## 2019-02-17 ENCOUNTER — Encounter: Payer: Self-pay | Admitting: Occupational Therapy

## 2019-02-24 ENCOUNTER — Encounter: Payer: Self-pay | Admitting: Occupational Therapy

## 2019-03-11 ENCOUNTER — Encounter: Payer: Self-pay | Admitting: Internal Medicine

## 2019-03-11 ENCOUNTER — Telehealth: Payer: Self-pay | Admitting: Diagnostic Neuroimaging

## 2019-03-11 MED ORDER — ALPRAZOLAM 0.5 MG PO TABS: ORAL_TABLET | ORAL | 0 refills | Status: DC

## 2019-03-11 NOTE — Addendum Note (Signed)
Addended by: Joycelyn Schmid R on: 03/11/2019 03:20 PM   Modules accepted: Orders

## 2019-03-11 NOTE — Telephone Encounter (Signed)
Pt wife(on DPR) has called asking that a sedative be called in for pt for is upcoming MRI. Please send to [facility] [phone]

## 2019-03-11 NOTE — Telephone Encounter (Signed)
Meds ordered this encounter  Medications  . ALPRAZolam (XANAX) 0.5 MG tablet    Sig: for sedation before MRI scan; take 1 tab 1 hour before scan; may repeat 1 tab 15 min before scan    Dispense:  3 tablet    Refill:  0    Suanne Marker, MD 03/11/2019, 3:20 PM Certified in Neurology, Neurophysiology and Neuroimaging  [facility] [address], [address] [place], [place] [address] [phone]

## 2019-03-11 NOTE — Telephone Encounter (Signed)
Pt's spouse is requesting a call back in reference to this patient receiving a sedative/prescription for his Mri sch with [facility] on 03/13/2019 sch by [facility]. Pt was instructed to contact PCP office in reference to getting a medication for this procedure.

## 2019-03-11 NOTE — Telephone Encounter (Addendum)
Spoke with wife and advised her that Xanax Rx was sent in by Dr Marjory Lies with instructions on how to take. She  verbalized understanding then asked if he can add on MRI cervical spine with contrast. The patient wasn't able to "go back into the machine " in March to have C spine scanned with contrast. I advised will send note to Dr Marjory Lies. If he agrees it will have to go through insurance authorization. His MRI L spine is Fri 2:50 pm; she is aware we are closed on Fridays. She verbalized understanding, appreciation.

## 2019-03-12 ENCOUNTER — Other Ambulatory Visit: Payer: Self-pay | Admitting: Internal Medicine

## 2019-03-12 NOTE — Telephone Encounter (Signed)
Go ahead with MRI lumbar for now. Contrast not needed for c-spine. -VRP

## 2019-03-12 NOTE — Telephone Encounter (Signed)
LVM informing patient that Dr Marjory Lies will go forward with MRI lumbar spine for now. He reviewed MRI cervical spine and does not feel MRI with contrast is needed at this time. I advised he will get a call back with MRI lumbar spine results approximately 4 days after it is completed. Left office number.

## 2019-03-13 ENCOUNTER — Other Ambulatory Visit: Payer: BLUE CROSS/BLUE SHIELD

## 2019-03-17 ENCOUNTER — Other Ambulatory Visit: Payer: Self-pay

## 2019-03-27 ENCOUNTER — Ambulatory Visit
Admission: RE | Admit: 2019-03-27 | Discharge: 2019-03-27 | Disposition: A | Discharge status: Home / Self Care | Payer: BC Managed Care – PPO | Source: Ambulatory Visit | Attending: Diagnostic Neuroimaging | Admitting: Diagnostic Neuroimaging

## 2019-03-27 DIAGNOSIS — M79605 Pain in left leg: Secondary | ICD-10-CM | POA: Diagnosis not present

## 2019-03-27 MED ORDER — GADOBENATE DIMEGLUMINE 529 MG/ML IV SOLN
20.0000 mL | Freq: Once | INTRAVENOUS | Status: AC | PRN
  Administered 2019-03-27: 20 mL via INTRAVENOUS

## 2019-03-31 ENCOUNTER — Telehealth: Payer: Self-pay | Admitting: *Deleted

## 2019-03-31 NOTE — Telephone Encounter (Signed)
Pt has called RN Stanton Kidney C back. The message was relayed to him from her, he is still asking for a call to discuss

## 2019-03-31 NOTE — Telephone Encounter (Signed)
LVM informing patient his MRI lumbar spine result is unremarkable. No acute findings or cord compression. Left number for questions.

## 2019-03-31 NOTE — Addendum Note (Signed)
Addended by: Minna Antis on: 03/31/2019 10:19 AM   Modules accepted: Orders

## 2019-03-31 NOTE — Telephone Encounter (Addendum)
Called patient who stated he is still having issues as reported in January. He is also having new symptoms. He stated he has pain on left side of his body, especially in the hip, ankle and  groin area, shooting up his back to left shoulder. He has difficulty walking and stated "to relax my left shoulder hurts, and my left side is sore and aggravated". He and his wife requested a follow up to discuss both MRIs in detail and discuss next steps due to ongoing issues and additional symptoms.  I advised them that due to current COVID 19 pandemic, our office is severely reducing in person visits in order to minimize the risk to our patients and healthcare providers. We recommend his appointment be a video visit. We'll take all precautions to reduce any security or privacy concerns. This will be treated like an office visit, and we will file with your insurance. He consented to video visit. His e mail: [email or web address]. I updated his EMR, advised he will get e mail with link to [email or web address].  Patient verbalized understanding, appreciation. E mail sent.

## 2019-04-01 ENCOUNTER — Other Ambulatory Visit: Payer: Self-pay

## 2019-04-01 ENCOUNTER — Ambulatory Visit (INDEPENDENT_AMBULATORY_CARE_PROVIDER_SITE_OTHER): Payer: BC Managed Care – PPO | Admitting: Diagnostic Neuroimaging

## 2019-04-01 ENCOUNTER — Encounter: Payer: Self-pay | Admitting: Diagnostic Neuroimaging

## 2019-04-01 DIAGNOSIS — R200 Anesthesia of skin: Secondary | ICD-10-CM

## 2019-04-01 DIAGNOSIS — R5383 Other fatigue: Secondary | ICD-10-CM

## 2019-04-01 DIAGNOSIS — G3281 Cerebellar ataxia in diseases classified elsewhere: Secondary | ICD-10-CM | POA: Diagnosis not present

## 2019-04-01 DIAGNOSIS — R0681 Apnea, not elsewhere classified: Secondary | ICD-10-CM
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
## 2019-04-05 ENCOUNTER — Encounter: Payer: Self-pay | Admitting: *Deleted

## 2019-04-06 ENCOUNTER — Telehealth: Payer: Self-pay | Admitting: Diagnostic Neuroimaging

## 2019-04-06 NOTE — Telephone Encounter (Signed)
BCBS Auth: [id number] (exp. 04/06/19 to 10/02/19) order sent to GI. They will reach out to the patient to schedule.

## 2019-04-20 ENCOUNTER — Encounter: Payer: Self-pay | Admitting: Neurology

## 2019-04-20 ENCOUNTER — Ambulatory Visit (INDEPENDENT_AMBULATORY_CARE_PROVIDER_SITE_OTHER): Payer: BC Managed Care – PPO | Admitting: Neurology

## 2019-04-20 ENCOUNTER — Other Ambulatory Visit: Payer: Self-pay

## 2019-04-20 VITALS — BP 141/87 | HR 56 | Ht 69.0 in | Wt 231.0 lb

## 2019-04-20 DIAGNOSIS — E669 Obesity, unspecified: Secondary | ICD-10-CM

## 2019-04-20 DIAGNOSIS — R0683 Snoring: Secondary | ICD-10-CM | POA: Diagnosis not present

## 2019-04-20 DIAGNOSIS — G4719 Other hypersomnia: Secondary | ICD-10-CM | POA: Diagnosis not present

## 2019-04-20 DIAGNOSIS — R0681 Apnea, not elsewhere classified: Secondary | ICD-10-CM | POA: Diagnosis not present
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
## 2019-04-20 NOTE — Patient Instructions (Signed)

## 2019-04-27 ENCOUNTER — Telehealth: Payer: Self-pay | Admitting: Diagnostic Neuroimaging

## 2019-04-27 NOTE — Telephone Encounter (Signed)
Pts wife called in and stated that she feels her husband isn't getting the enough amount of care, and she feels he is being just diagnosed with obesity and pushing him off, she states she feels like no one is understanding that her husband has been dealing with his issue since he was 28 years old and he shouldn't. She feels that he needs to see another healthcare provider. She is requesting to speak with Practice Admin or someone over the doctors that can lead her in the right direction and understand her concern.  CB# [phone]

## 2019-04-28 DIAGNOSIS — F411 Generalized anxiety disorder: Secondary | ICD-10-CM | POA: Diagnosis not present

## 2019-04-28 DIAGNOSIS — F3181 Bipolar II disorder: Secondary | ICD-10-CM | POA: Diagnosis not present

## 2019-04-28 NOTE — Telephone Encounter (Signed)
Called and LVM

## 2019-04-29 NOTE — Telephone Encounter (Signed)
I called the patient.  I explained our process if she requested to see another physician our office.  She voiced understanding.  I explained that if she was requesting to see a physician outside of our office she should obtain a new referral from her PCP.  I encouraged the patient to follow through with the work-up Dr. Leta Baptist has suggested.  She voiced understanding and stated that his MRI was scheduled and he has had a sleep evaluation.

## 2019-04-29 NOTE — Telephone Encounter (Signed)
Pt's wife returned call  

## 2019-05-11 ENCOUNTER — Telehealth: Payer: Self-pay | Admitting: Diagnostic Neuroimaging

## 2019-05-11 MED ORDER — ALPRAZOLAM 0.5 MG PO TABS: ORAL_TABLET | ORAL | 0 refills | Status: DC

## 2019-05-11 NOTE — Telephone Encounter (Signed)
Patient's wife in lobby requesting medication before MRI tomorrow. Best call back [phone].

## 2019-05-11 NOTE — Telephone Encounter (Signed)
Meds ordered this encounter  Medications  . ALPRAZolam (XANAX) 0.5 MG tablet    Sig: for sedation before MRI scan; take 1 tab 1 hour before scan; may repeat 1 tab 15 min before scan    Dispense:  3 tablet    Refill:  0    Penni Bombard, MD 1/69/6789, 3:81 PM Certified in Neurology, Neurophysiology and Neuroimaging  [facility] [address] [place], [place] [address] [phone]

## 2019-05-11 NOTE — Telephone Encounter (Signed)
Patient's wife requesting medication for pt before MRI at [facility] tomorrow at 1:00. Best call back [phone]

## 2019-05-12 ENCOUNTER — Ambulatory Visit
Admission: RE | Admit: 2019-05-12 | Discharge: 2019-05-12 | Disposition: A | Discharge status: Home / Self Care | Payer: BC Managed Care – PPO | Source: Ambulatory Visit | Attending: Diagnostic Neuroimaging | Admitting: Diagnostic Neuroimaging

## 2019-05-12 ENCOUNTER — Other Ambulatory Visit: Payer: Self-pay

## 2019-05-12 DIAGNOSIS — G3281 Cerebellar ataxia in diseases classified elsewhere: Secondary | ICD-10-CM | POA: Diagnosis not present

## 2019-05-12 MED ORDER — GADOBENATE DIMEGLUMINE 529 MG/ML IV SOLN
20.0000 mL | Freq: Once | INTRAVENOUS | Status: AC | PRN
  Administered 2019-05-12: 20 mL via INTRAVENOUS

## 2019-05-12 NOTE — Telephone Encounter (Signed)
Called wife and informed her Dr Leta Baptist sent in Rx for MRI today. She stated her husband picked it up last night, verbalized understanding, appreciation.

## 2019-05-13 ENCOUNTER — Other Ambulatory Visit: Payer: Self-pay

## 2019-05-13 ENCOUNTER — Ambulatory Visit (HOSPITAL_COMMUNITY)
Admission: RE | Admit: 2019-05-13 | Discharge: 2019-05-13 | Disposition: A | Discharge status: Home / Self Care | Payer: BC Managed Care – PPO | Source: Ambulatory Visit | Attending: Student in an Organized Health Care Education/Training Program | Admitting: Student in an Organized Health Care Education/Training Program

## 2019-05-13 ENCOUNTER — Ambulatory Visit: Payer: BC Managed Care – PPO | Admitting: Internal Medicine

## 2019-05-13 ENCOUNTER — Ambulatory Visit: Payer: BC Managed Care – PPO | Admitting: Neurology

## 2019-05-13 ENCOUNTER — Encounter: Payer: Self-pay | Admitting: Internal Medicine

## 2019-05-13 VITALS — BP 138/82 | HR 70 | Temp 98.5°F | Ht 69.0 in | Wt 233.6 lb

## 2019-05-13 DIAGNOSIS — M79605 Pain in left leg: Secondary | ICD-10-CM | POA: Insufficient documentation

## 2019-05-13 DIAGNOSIS — G8929 Other chronic pain: Secondary | ICD-10-CM | POA: Diagnosis not present

## 2019-05-13 DIAGNOSIS — G4719 Other hypersomnia: Secondary | ICD-10-CM

## 2019-05-13 DIAGNOSIS — F319 Bipolar disorder, unspecified: Secondary | ICD-10-CM | POA: Diagnosis not present

## 2019-05-13 DIAGNOSIS — M25552 Pain in left hip: Secondary | ICD-10-CM | POA: Diagnosis not present

## 2019-05-13 DIAGNOSIS — Z79899 Other long term (current) drug therapy: Secondary | ICD-10-CM

## 2019-05-13 DIAGNOSIS — G4733 Obstructive sleep apnea (adult) (pediatric): Secondary | ICD-10-CM

## 2019-05-13 DIAGNOSIS — R0683 Snoring: Secondary | ICD-10-CM | POA: Diagnosis not present

## 2019-05-13 DIAGNOSIS — E669 Obesity, unspecified: Secondary | ICD-10-CM

## 2019-05-13 DIAGNOSIS — R0681 Apnea, not elsewhere classified: Secondary | ICD-10-CM

## 2019-05-13 NOTE — Patient Instructions (Signed)
Paul Jarvis,  It was a pleasure seeing you in clinic today. Today we discussed your left hip and groin pain.   At this time, we are getting an x-ray of your left hip to assess for any bone abnormalities. Please continue to follow up with your neurologist for further management.   Please contact us if you have any concerns.   Thank you!

## 2019-05-13 NOTE — Assessment & Plan Note (Signed)
Patient with left sided back and leg pain since 2014 consistent with sciatica presents to clinic for left groin pain. He reports that the pain radiates from the left hip to the groin area and has been present since summer of 2019. He reports that previously the pain was somewhat relieved with intercourse but he reports that is no longer the case. He reports that he has developed hypersensitivity along the left scrotum and left side of his penis. He also feels pain specifically in left buttocks and left groin area upon defecation but denies any rectal pain or spasms. He denies any fever, scrotal swelling or erythema, penile discharge or lymphadenopathy.  He has been following up with neurology and recently had MRI of cervical spine, lumbar spine and brain. MRI of lumbar spine only showed degenerative disease at L4-S1 but no spinal stenosis or nerve root compression. EMG on 12/28/2018 showed no large fiber neuropathy. Prior workup negative for rheumatologic or vasculitis causes of pain.  On physical examination, there is tenderness to palpation of left lower back, left piriformis muscle and left lateral hip without any obvious sensory/motor deficits. There is pain with internal and external rotation of the hip and with extension of the hip. No edema, crepitus or obvious deformity noted.   - Xray of hip  - Follow up with neurology recommendations - Continuation of PT

## 2019-05-13 NOTE — Assessment & Plan Note (Signed)
Patient is currently on duloxetine 30mg  and carbamazepine 200mg  daily for the past week. Patient is tolerating well. Denies depressed mood,  suicidal or homicidal ideation.   - Continue current regimen
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
## 2019-05-15 NOTE — Progress Notes (Signed)
Internal Medicine Clinic Attending  I saw and evaluated the patient.  I personally confirmed the key portions of the history and exam documented by Dr. Aslam and I reviewed pertinent patient test results.  The assessment, diagnosis, and plan were formulated together and I agree with the documentation in the resident's note.     

## 2019-05-18 ENCOUNTER — Telehealth: Payer: Self-pay | Admitting: *Deleted

## 2019-05-18 DIAGNOSIS — R29898 Other symptoms and signs involving the musculoskeletal system: Secondary | ICD-10-CM

## 2019-05-18 DIAGNOSIS — M5020 Other cervical disc displacement, unspecified cervical region: Secondary | ICD-10-CM

## 2019-05-18 NOTE — Telephone Encounter (Signed)
Unable to LVM, mailbox full.  °

## 2019-05-19 ENCOUNTER — Telehealth: Payer: Self-pay

## 2019-05-19 NOTE — Progress Notes (Signed)
Patient referred by Dr. Leta Baptist, seen by me on 04/20/19, HST on 05/13/19.    Please call and notify the patient that the recent home sleep test showed obstructive sleep apnea. OSA is overall mild, but worth treating to see if he feels better after treatment. Treatment with a positive airway pressure machine is a reasonable choice. To that end, I recommend treatment for OSA in the form of autoPAP, which means, that we don't have to bring him in for a sleep study with CPAP, but will let him try an autoPAP machine at home, through a DME company (of his choice, or as per insurance requirement). The DME representative will educate him on how to use the machine, how to put the mask on, etc. I have placed an order in the chart. Please remind patient about the importance of compliance with treatment (which is an insurance requirement), the need for a timely follow-up appointment, and the importance of cleanliness with the device and its parts (the DME representative will be able to explain and answer questions).  I have not placed an order yet. Please let me know. Weight loss and avoiding sleeping on the back may be a treatment option, but takes a while. Dental treatment with a mouth appliance can be consider, also takes a little while to get the right fit, and adjustment, etc. We can send referral to a dentist, if he would like to try a dental device.  If he is okay with proceeding with autoPAP, we can put order in and he will need a FU in sleep clinic for 10 weeks post-PAP set up. Thanks,   Star Age, MD, PhD [facility])

## 2019-05-19 NOTE — Telephone Encounter (Signed)
I called pt to discuss. No answer, left a message asking him to call me back. 

## 2019-05-19 NOTE — Telephone Encounter (Signed)
-----   Message from Star Age, MD sent at 05/19/2019  8:04 AM EDT ----- Patient referred by Dr. Leta Baptist, seen by me on 04/20/19, HST on 05/13/19.    Please call and notify the patient that the recent home sleep test showed obstructive sleep apnea. OSA is overall mild, but worth treating to see if he feels better after treatment. Treatment with a positive airway pressure machine is a reasonable choice. To that end, I recommend treatment for OSA in the form of autoPAP, which means, that we don't have to bring him in for a sleep study with CPAP, but will let him try an autoPAP machine at home, through a DME company (of his choice, or as per insurance requirement). The DME representative will educate him on how to use the machine, how to put the mask on, etc. I have placed an order in the chart. Please remind patient about the importance of compliance with treatment (which is an insurance requirement), the need for a timely follow-up appointment, and the importance of cleanliness with the device and its parts (the DME representative will be able to explain and answer questions).  I have not placed an order yet. Please let me know. Weight loss and avoiding sleeping on the back may be a treatment option, but takes a while. Dental treatment with a mouth appliance can be consider, also takes a little while to get the right fit, and adjustment, etc. We can send referral to a dentist, if he would like to try a dental device.  If he is okay with proceeding with autoPAP, we can put order in and he will need a FU in sleep clinic for 10 weeks post-PAP set up. Thanks,   Star Age, MD, PhD [facility])

## 2019-05-19 NOTE — Procedures (Signed)
Patient Information     First Name: Paul Last Name: Ahmadou Jarvis: [id number]  Birth Date: 1991/07/27 Age: 28 Gender: Male  Referring Provider: Bonnita Levan Penumalli,MD BMI: 34.3 (W=231 lb, H=5' 9'')  Neck Circ.:  18 '' Epworth:  9   Sleep Study Information    Study Date: May 13, 2019 S/H/A Version: [id number] / 4.1.1528 / 33  History:     28 year old man with a history of anxiety, depression, migraine headaches, right upper extremity paresthesias, and obesity, who reports snoring and excessive daytime somnolence.   Summary & Diagnosis:      OSA Recommendations:       This home sleep test demonstrates overall mild obstructive sleep apnea with a total AHI of 12.5/hour and O2 nadir of 85%. Given the patient's medical history and sleep related complaints, treatment with positive airway pressure can be considered. This can be achieved in the form of autoPAP trial/titration at home. A full night CPAP titration study will help with proper treatment settings and mask fitting if needed. Other treatment options include weight loss along with avoidance of the supine sleep position, an oral appliance through a qualified dentist, airway surgical procedures (through ENT). Options will be discussed with the patient.   Please note that untreated obstructive sleep apnea may carry additional perioperative morbidity. Patients with significant obstructive sleep apnea should receive perioperative PAP therapy and the surgeons and particularly the anesthesiologist should be informed of the diagnosis and the severity of the sleep disordered breathing. The patient should be cautioned not to drive, work at heights, or operate dangerous or heavy equipment when tired or sleepy. Review and reiteration of good sleep hygiene measures should be pursued with any patient. Other causes of the patient's symptoms, including circadian rhythm disturbances, an underlying mood disorder, medication effect and/or an underlying medical problem  cannot be ruled out based on this test. Clinical correlation is recommended.   The patient and his referring provider will be notified of the test results. The patient will be seen in follow up in sleep clinic at [facility].  I certify that I have reviewed the raw data recording prior to the issuance of this report in accordance with the standards of the American Academy of Sleep Medicine (AASM).  Star Age, MD, PhD [facility]) Diplomat, ABPN (Neurology and Sleep)           Sleep Summary  Oxygen Saturation Statistics   Start Study Time: End Study Time: Total Recording Time:  10:08:33PM 7:24:40 AM 9 hrs, 20min  Total Sleep Time % REM of Sleep Time:  7 hrs, 57 min 25.2    Mean: 97 Minimum: 85 Maximum: 100  Mean of Desaturations Nadirs (%):   93  Oxygen Desatur. %:   4-9 10-20 >20 Total  Events Number Total    25  6 80.6 19.4  0 0.0  31 100.0  Oxygen Saturation: <90 <=88 <85 <80 <70  Duration (minutes): Sleep % 0.3 0.1  0.2 0.0  0.0 0.0 0.0 0.0 0.0 0.0     Respiratory Indices      Total Events REM NREM All Night  pRDI:  84  pAHI:  73 ODI:  31  pAHIc:  0  % CSR: 0.0 42.2 40.8 21.1 0.0 10.5 8.6 3.1 0.0 14.4 12.5 5.3 0.0       Pulse Rate Statistics during Sleep (BPM)      Mean: 55 Minimum: N/A Maximum: 107    Indices are calculated using technically  valid sleep time of  5 hrs, 50 min. Central-Indices are calculated using technically valid sleep time of  5  hrs, 41 min. pRDI/pAHI are calculated using oxi desaturations ? 3% Sit N/A Body Position Statistics  Position Supine Prone Right Left Non-Supine  Sleep (min) 173.5 47.5 174.5 82.1 304.1  Sleep % 36.3 9.9 36.5 17.2 63.7  pRDI 16.2 8.5 11.6 21.5 12.9  pAHI 15.8 3.4 8.6 19.8 9.7  ODI 8.7 0.0 2.5 5.0 2.5     Snoring Statistics Snoring Level (dB) >40 >50 >60 >70 >80 >Threshold (45)  Sleep (min) 68.9 3.2 1.3 0.6 0.0 6.0  Sleep % 14.4 0.7 0.3 0.1 0.0 1.3    Mean: 40 dB  Sleep Stages Chart                   pAHI=12.5                               Mild              Moderate                    Severe

## 2019-05-19 NOTE — Addendum Note (Signed)
Addended by: Andrey Spearman R on: 05/19/2019 04:53 PM   Modules accepted: Orders

## 2019-05-19 NOTE — Telephone Encounter (Signed)
Will refer to [place] for cervical disc herniation. -VRP

## 2019-05-19 NOTE — Telephone Encounter (Addendum)
Spoke with patient and informed him the MRI brain result is unremarkable. He stated his wife had a question, and she got on phone. She stated his PCP suggested a neurosurgery consult because his symptoms aren't better.  He has had an increase in symptoms he reported in June virtual visit.  It takes him almost 1 hour to get out of bed. She stated she read the MRI cervical spine result in my chart and wants to know what'll be done about the disk herniation and wanted to know if PT would help. He did 3 months of PT for back pain late last year but didn't improve. I advised he would need a new referral for PT for neck issues.  She would like to know if PT for neck is appropriate or if a neurosurgery consult is appropriate, " what are the options to address disk herniation in his neck?"   She is asking for next steps to address his increasing symptoms. I advised will discuss with Dr Leta Baptist and let her know. She  verbalized understanding, appreciation.

## 2019-05-20 NOTE — Telephone Encounter (Signed)
Spoke with patient and informed him Dr Leta Baptist agreed that next best step is neurosurgery consult. I advised the patient if he hasn't heard back in a week he may call [facility] or our office to check on referral status. He Patient verbalized understanding, appreciation.

## 2019-05-20 NOTE — Telephone Encounter (Signed)
Pt spoke with sleep lab staff and said that he wanted to try a cpap.  I called pt again to discuss. No answer, left a message asking him to call me back.

## 2019-05-20 NOTE — Telephone Encounter (Signed)
I called pt again to discuss his sleep study results. No answer, left a message asking him to call me back. 

## 2019-05-21 NOTE — Telephone Encounter (Signed)
I called pt again to discuss his sleep study results. No answer, left a message asking him to call me back.  This is my third unsuccessful attempt at reaching this pt by phone. I have already sent pt a mychart message regarding his results.

## 2019-06-01 ENCOUNTER — Ambulatory Visit: Payer: BC Managed Care – PPO | Admitting: Family Medicine

## 2019-06-01 ENCOUNTER — Other Ambulatory Visit: Payer: Self-pay

## 2019-06-01 VITALS — BP 128/84 | Ht 69.0 in | Wt 233.0 lb

## 2019-06-01 DIAGNOSIS — M25511 Pain in right shoulder: Secondary | ICD-10-CM | POA: Diagnosis not present

## 2019-06-01 DIAGNOSIS — M545 Low back pain, unspecified: Secondary | ICD-10-CM

## 2019-06-01 DIAGNOSIS — R030 Elevated blood-pressure reading, without diagnosis of hypertension: Secondary | ICD-10-CM | POA: Diagnosis not present

## 2019-06-01 DIAGNOSIS — M79605 Pain in left leg: Secondary | ICD-10-CM

## 2019-06-01 DIAGNOSIS — G8929 Other chronic pain: Secondary | ICD-10-CM

## 2019-06-01 DIAGNOSIS — Z6834 Body mass index (BMI) 34.0-34.9, adult: Secondary | ICD-10-CM | POA: Diagnosis not present

## 2019-06-01 MED ORDER — PREDNISONE 10 MG PO TABS: ORAL_TABLET | ORAL | 0 refills | Status: DC

## 2019-06-01 NOTE — Patient Instructions (Signed)
Start prednisone 6 day dose pack for your back and neck. Consider physical therapy - let me know if you want to do this for your lower back after your appointment with the neurosurgeon today. Ok to take methocarbamol with prednisone Don't take aleve, meloxicam, ibuprofen while on the prednisone though. Heat as needed for spasms.  I'd recommend waiting on shoulder issue for now until your neck is addressed. You do have AC joint arthropathy with instability that has not responded to conservative measures and multiple injections. Follow up with me in 1 month but let me know if you'd like to do therapy for your low back prior to that.

## 2019-06-02 ENCOUNTER — Encounter: Payer: Self-pay | Admitting: Family Medicine
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
## 2019-06-03 DIAGNOSIS — M24811 Other specific joint derangements of right shoulder, not elsewhere classified: Secondary | ICD-10-CM | POA: Diagnosis not present

## 2019-08-07 ENCOUNTER — Telehealth: Payer: Self-pay | Admitting: Internal Medicine

## 2019-08-07 ENCOUNTER — Ambulatory Visit (INDEPENDENT_AMBULATORY_CARE_PROVIDER_SITE_OTHER): Payer: BC Managed Care – PPO | Admitting: Internal Medicine

## 2019-08-07 DIAGNOSIS — M79605 Pain in left leg: Secondary | ICD-10-CM

## 2019-08-07 DIAGNOSIS — M5117 Intervertebral disc disorders with radiculopathy, lumbosacral region: Secondary | ICD-10-CM | POA: Diagnosis not present

## 2019-08-07 DIAGNOSIS — N3944 Nocturnal enuresis: Secondary | ICD-10-CM | POA: Diagnosis not present

## 2019-08-07 NOTE — Telephone Encounter (Signed)
Left side stomach Sharp pain running down to his Foot. Pt states it is hurting really bad and would like for a nurse to call back.

## 2019-08-07 NOTE — Telephone Encounter (Signed)
Returned call to patient. States 1 month hx left sided pain that has progressively gotten worse. Notes pain is also in left groin, and left buttock, and runs down left leg to left foot. Placed on today's [facility] schedule for telehealth visit. Hubbard Hartshorn, BSN, RN-BC

## 2019-08-07 NOTE — Telephone Encounter (Signed)
Please schedule this patient for an in person visit early this week. Thank you,.
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
## 2019-08-07 NOTE — Telephone Encounter (Signed)
Attempted to call patient. No answer. HIPPA compliant voicemail left.   Thanks,  Ina Homes, MD

## 2019-08-07 NOTE — Assessment & Plan Note (Signed)
Patient calls in to discuss left leg pain. Previously diagnosed with sciatica. States that he feels his symptoms have progressed. The pain radiates into his abdomen, up his back, into his left groin, and down his left leg. Nothing seems to improve it. He has not had any trauma, weight loss, infectious symptoms, use of IV drugs, history of malignancy. He has had nocturnal urinary incontinence over the past 2 night. No day time symptoms or changes in bowel habits. Denies focal weakness. Denies history of neurological symptoms that have spontaneously resolved in the past.   MRI in 03/2019 with L4-S1 mild degenerative disc disease. No root impingement.   A/P: - Given nocturnal urinary incontinence discussed the need for further evaluation and neurological exam. Will go to urgent care then to the [facility] early next week.

## 2019-08-07 NOTE — Telephone Encounter (Signed)
Attempted to call patient two more times. No answer. HIPPA compliant voicemail left.   Thanks,  Ina Homes, MD

## 2019-08-10 NOTE — Telephone Encounter (Signed)
Spoke with the patient.  The patient has sch an appt to f/u with [facility] on Friday 08/14/2019 @ 9:15am.

## 2019-08-10 NOTE — Progress Notes (Signed)
Internal Medicine Clinic Attending  Case discussed with Dr. Helberg at the time of the visit.  We reviewed the resident's history and exam and pertinent patient test results.  I agree with the assessment, diagnosis, and plan of care documented in the resident's note.    

## 2019-08-13 ENCOUNTER — Encounter: Payer: Self-pay | Admitting: Physical Therapy

## 2019-08-13 NOTE — Therapy (Signed)
[facility] [address] [place], [place], [address] Phone: [phone]   Fax:  [phone]  Patient Details  Name: Paul Jarvis MRN: [id number] Date of Birth: 1991/05/21 Referring Provider:  No ref. provider found  Encounter Date: 08/13/2019  PHYSICAL THERAPY DISCHARGE SUMMARY  Visits from Start of Care: 7  Current functional level related to goals / functional outcomes: Unable to assess; pt put on hold due to clinic closing due to [place] but following clinic re-opening pt did not return to resume therapy.  If pt would like to continue with therapy he will need a new referral to PT and OT   Remaining deficits: Pain, impaired ROM, impaired strength   Education / Equipment: HEP  Plan: Patient agrees to discharge.  Patient goals were not met. Patient is being discharged due to not returning since the last visit.  ?????     Rico Junker, PT, DPT 08/13/19    4:08 PM    [facility] [address] [place], [place], [address] Phone: [phone]   Fax:  [phone]

## 2019-08-14 ENCOUNTER — Other Ambulatory Visit: Payer: Self-pay

## 2019-08-14 ENCOUNTER — Ambulatory Visit: Payer: BC Managed Care – PPO | Admitting: Internal Medicine

## 2019-08-14 ENCOUNTER — Ambulatory Visit (HOSPITAL_COMMUNITY)
Admission: RE | Admit: 2019-08-14 | Discharge: 2019-08-14 | Disposition: A | Discharge status: Home / Self Care | Payer: BC Managed Care – PPO | Source: Ambulatory Visit | Attending: Internal Medicine | Admitting: Internal Medicine

## 2019-08-14 ENCOUNTER — Encounter: Payer: Self-pay | Admitting: Internal Medicine

## 2019-08-14 VITALS — BP 129/80 | HR 86 | Temp 98.1°F | Ht 69.5 in | Wt 248.8 lb

## 2019-08-14 DIAGNOSIS — M79605 Pain in left leg: Secondary | ICD-10-CM

## 2019-08-14 DIAGNOSIS — G8929 Other chronic pain: Secondary | ICD-10-CM

## 2019-08-14 DIAGNOSIS — M549 Dorsalgia, unspecified: Secondary | ICD-10-CM

## 2019-08-14 MED ORDER — METHOCARBAMOL 500 MG PO TABS: 500.0000 mg | ORAL_TABLET | Freq: Two times a day (BID) | ORAL | 0 refills | Status: DC | PRN

## 2019-08-14 MED ORDER — PREDNISONE 10 MG PO TABS: ORAL_TABLET | ORAL | 0 refills | Status: DC

## 2019-08-14 NOTE — Assessment & Plan Note (Signed)
Patient evaluated for continued left back and leg pain. The pain continues to radiate down his leg into his great toe. He is tried over-the-counter anti-inflammatory is with no improvement. He did not go to urgent care or emergency department last Friday. He has had no more nocturnal urinary incontinence. He is not had any changes in symptoms. He denies trauma, weight loss, infectious symptoms, use of IV drugs, history of malignancy. His narrow exam is grossly unremarkable. He does have a positive ipsilateral straight leg test. He mentions in the past he has had significant improvement with the steroid dose pack.  A/P: - Acute on chronic back pain  - Start Prednisone taper  - Start Robaxin 500 mg BID PRN

## 2019-08-14 NOTE — Patient Instructions (Signed)
Thank you for allowing [place] to provide your care. Today we are going to get a picture of specific joints in your back and start you on a prednisone taper. Your pain should begin to improve. It is important that you stay active. Please come back to see [place] in 3 months or sooner if any issues arise.
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
## 2019-08-17 NOTE — Progress Notes (Signed)
Internal Medicine Clinic Attending  Case discussed with Dr. Helberg at the time of the visit.  We reviewed the resident's history and exam and pertinent patient test results.  I agree with the assessment, diagnosis, and plan of care documented in the resident's note.    

## 2019-08-25 ENCOUNTER — Ambulatory Visit (INDEPENDENT_AMBULATORY_CARE_PROVIDER_SITE_OTHER): Payer: BC Managed Care – PPO | Admitting: Internal Medicine

## 2019-08-25 ENCOUNTER — Other Ambulatory Visit: Payer: Self-pay

## 2019-08-25 VITALS — BP 146/93 | HR 78 | Temp 98.8°F | Ht 69.0 in | Wt 247.7 lb

## 2019-08-25 DIAGNOSIS — R32 Unspecified urinary incontinence: Secondary | ICD-10-CM | POA: Diagnosis not present

## 2019-08-25 DIAGNOSIS — F329 Major depressive disorder, single episode, unspecified: Secondary | ICD-10-CM

## 2019-08-25 DIAGNOSIS — G43909 Migraine, unspecified, not intractable, without status migrainosus: Secondary | ICD-10-CM

## 2019-08-25 DIAGNOSIS — G8929 Other chronic pain: Secondary | ICD-10-CM | POA: Diagnosis not present

## 2019-08-25 DIAGNOSIS — M545 Low back pain, unspecified: Secondary | ICD-10-CM

## 2019-08-25 DIAGNOSIS — M5416 Radiculopathy, lumbar region: Secondary | ICD-10-CM

## 2019-08-25 DIAGNOSIS — Z79899 Other long term (current) drug therapy: Secondary | ICD-10-CM

## 2019-08-25 DIAGNOSIS — F419 Anxiety disorder, unspecified: Secondary | ICD-10-CM

## 2019-08-25 LAB — BASIC METABOLIC PANEL
Anion gap: 9 (ref 5–15)
BUN: 17 mg/dL (ref 6–20)
CO2: 26 mmol/L (ref 22–32)
Calcium: 10 mg/dL (ref 8.9–10.3)
Chloride: 105 mmol/L (ref 98–111)
Creatinine, Ser: 1.39 mg/dL — ABNORMAL HIGH (ref 0.61–1.24)
GFR calc Af Amer: >60 mL/min (ref >60)
GFR calc non Af Amer: >60 mL/min (ref >60)
Glucose, Bld: 94 mg/dL (ref 70–99)
Potassium: 4.2 mmol/L (ref 3.5–5.1)
Sodium: 140 mmol/L (ref 135–145)

## 2019-08-25 LAB — POCT GLYCOSYLATED HEMOGLOBIN (HGB A1C): Hemoglobin A1C: 5.1 % (ref 4.0–5.6)

## 2019-08-25 LAB — GLUCOSE, CAPILLARY: Glucose-Capillary: 95 mg/dL (ref 70–99)

## 2019-08-25 MED ORDER — GABAPENTIN 100 MG PO CAPS: 100.0000 mg | ORAL_CAPSULE | Freq: Three times a day (TID) | ORAL | 2 refills | Status: DC
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
## 2019-08-25 NOTE — Assessment & Plan Note (Signed)
Patient comes with complaints of lower back pain, with radiation down the left leg. Patient states that his pain has been ongoing since 2017. He states that the pain was well managed with NSAIDS and muscle relaxants. He states that at the beginning of this year his pain began to increase, and was initially alleviated with steroid injections.   Over the past three weeks, his pain has continued to progress with no medications alleviating his pain. He describes some urinary incontinence two weeks ago, which is still occurring at the time of  His interview. We went over his SI joint xray's together, which show no osteonecrosis, or trauma. He describes the pain as an 8/10 today with a burning sensation that goes down to his L foot, with burning sensations in his L inner thigh, and loss of sensation of the L side of the penis and glans. The gradual increase in pain, with urinary incontinence is concerning for cord compression. With a normal SI joint xray, and new onset symptoms, a MRI Lumbar spine is warranted to assess for spinal cord compression.  P: - MRI Spine Ordered - A1C ordered - Start Gabapentin 100 TID - BMP ordered.

## 2019-08-25 NOTE — Patient Instructions (Signed)
Paul Jarvis,   It was a pleasure meeting you today! Today we spoke about your back pain. We will get a MRI of your lumbar spine and I have ordered anti anxiety medication protocols for your imaging. In the meantime, We will start gabapentin for your pain control. Please take the medication as indicated on the label. If you can, please get the MRI this week, and we will see you next week in the office to go over the imaging and next steps of care. I look forward to meeting you again.  Sincerely,  Maudie Mercury, MD

## 2019-08-28 ENCOUNTER — Encounter: Payer: Self-pay | Admitting: Internal Medicine

## 2019-08-31 ENCOUNTER — Encounter: Payer: Self-pay | Admitting: Internal Medicine

## 2019-08-31 ENCOUNTER — Telehealth: Payer: Self-pay

## 2019-08-31 NOTE — Telephone Encounter (Signed)
Requesting lab results, please call pt back.  

## 2019-08-31 NOTE — Progress Notes (Signed)
Internal Medicine Clinic Attending  I saw and evaluated the patient.  I personally confirmed the key portions of the history and exam documented by Dr. Winters and I reviewed pertinent patient test results.  The assessment, diagnosis, and plan were formulated together and I agree with the documentation in the resident's note.  

## 2019-09-01 ENCOUNTER — Encounter: Payer: Self-pay | Admitting: Internal Medicine

## 2019-09-01 NOTE — Telephone Encounter (Signed)
Pt calling back for lab results, please call pt back.  

## 2019-09-01 NOTE — Telephone Encounter (Signed)
Hey! I just wanted to let you know that I am checking on a couple things with Dr. Lynnae January and will give him a call when I hear back. I sent him a mychart message stating that but just in case he doesn't get that message and calls back, I am aware and am following up on it. Thanks

## 2019-09-01 NOTE — Telephone Encounter (Signed)
To all,  I reached out to the patient yesterday afternoon and left a HIPAA compliant voicemail when he did not pick up. Patient picked up the phone this afternoon and his results were discussed at length. Have a pleasant evening.  Sincerely,  Maudie Mercury, MD

## 2019-09-05 ENCOUNTER — Ambulatory Visit (HOSPITAL_COMMUNITY)
Admission: RE | Admit: 2019-09-05 | Discharge: 2019-09-05 | Disposition: A | Discharge status: Home / Self Care | Payer: BC Managed Care – PPO | Source: Ambulatory Visit | Attending: Internal Medicine | Admitting: Internal Medicine

## 2019-09-05 ENCOUNTER — Other Ambulatory Visit: Payer: Self-pay

## 2019-09-05 DIAGNOSIS — M545 Low back pain, unspecified: Secondary | ICD-10-CM

## 2019-09-05 DIAGNOSIS — G8929 Other chronic pain: Secondary | ICD-10-CM | POA: Diagnosis not present

## 2019-09-15 ENCOUNTER — Ambulatory Visit: Payer: BC Managed Care – PPO

## 2019-09-22 ENCOUNTER — Encounter: Payer: Self-pay | Admitting: Internal Medicine

## 2019-09-22 ENCOUNTER — Ambulatory Visit (INDEPENDENT_AMBULATORY_CARE_PROVIDER_SITE_OTHER): Payer: BC Managed Care – PPO | Admitting: Internal Medicine

## 2019-09-22 VITALS — BP 134/88 | HR 67 | Temp 98.4°F | Wt 250.6 lb

## 2019-09-22 DIAGNOSIS — F3130 Bipolar disorder, current episode depressed, mild or moderate severity, unspecified: Secondary | ICD-10-CM

## 2019-09-22 DIAGNOSIS — F319 Bipolar disorder, unspecified: Secondary | ICD-10-CM

## 2019-09-22 DIAGNOSIS — G8929 Other chronic pain: Secondary | ICD-10-CM

## 2019-09-22 DIAGNOSIS — Z79899 Other long term (current) drug therapy: Secondary | ICD-10-CM | POA: Diagnosis not present

## 2019-09-22 DIAGNOSIS — M5117 Intervertebral disc disorders with radiculopathy, lumbosacral region: Secondary | ICD-10-CM | POA: Diagnosis not present

## 2019-09-22 DIAGNOSIS — M545 Low back pain, unspecified: Secondary | ICD-10-CM

## 2019-09-22 NOTE — Assessment & Plan Note (Signed)
Patient presents for follow-up of his back pain with left leg radiculopathy. We reviewed his MRI that showed mild disc protrusion at L5 S1 but with no stenosis. He is currently taking gabapentin 100 mg three times daily. He is tolerating this medication well without any apparent side effects. He states that he continues to have radiculopathy symptoms.  A/P: - Increase gabapentin to 100 mg with breakfast, 100 mg with lunch, and 300 mg QHS.  - Patient will call in 3-4 weeks for further instructions on titration of the gabapentin

## 2019-09-22 NOTE — Assessment & Plan Note (Signed)
Patient is currently on duloxetine 30 mg and carbamazepine 200 mg daily. He is tolerating both these medications well. He would like to start cognitive behavioral therapy.  A/P: - Referral for CBT

## 2019-09-22 NOTE — Patient Instructions (Signed)
Thank you for allowing [place] to provide your care. Today we are going to be repeating some blood work. I will call you if we need to change anything.  I would like you to increase her gabapentin to 300 mg at night, 100 mg with breakfast, and 100 mg with lunch. If you tolerate this well after taking it for 2 to 3 weeks please call Dr. Darrick Meigs for further titration of the medication if you have not gotten significant relief in your pain.  I have put in a referral to Dessie Coma for cognitive behavioral therapy. She should be calling you within the next 1 to 2 weeks.  Please come back to see Dr. Darrick Meigs in three months or sooner if any issues arise.
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
## 2019-09-23 LAB — BMP8+ANION GAP
Anion Gap: 16 mmol/L (ref 10.0–18.0)
BUN/Creatinine Ratio: 9 (ref 9–20)
BUN: 11 mg/dL (ref 6–20)
CO2: 22 mmol/L (ref 20–29)
Calcium: 9.7 mg/dL (ref 8.7–10.2)
Chloride: 104 mmol/L (ref 96–106)
Creatinine, Ser: 1.26 mg/dL (ref 0.76–1.27)
GFR calc Af Amer: 88 mL/min/{1.73_m2} (ref >59)
GFR calc non Af Amer: 76 mL/min/{1.73_m2} (ref >59)
Glucose: 80 mg/dL (ref 65–99)
Potassium: 4.2 mmol/L (ref 3.5–5.2)
Sodium: 142 mmol/L (ref 134–144)

## 2019-09-23 LAB — CK: Total CK: 427 U/L (ref 49–439)

## 2019-09-23 NOTE — Progress Notes (Signed)
Internal Medicine Clinic Attending  Case discussed with Dr. Helberg at the time of the visit.  We reviewed the resident's history and exam and pertinent patient test results.  I agree with the assessment, diagnosis, and plan of care documented in the resident's note.    

## 2019-09-30 ENCOUNTER — Telehealth: Payer: Self-pay | Admitting: Licensed Clinical Social Worker

## 2019-09-30 NOTE — Telephone Encounter (Signed)
Patient called to discuss referral. Patient agreed to services, and will be added to my schedule for 1/7 @ 12:00.

## 2019-10-07 ENCOUNTER — Other Ambulatory Visit: Payer: Self-pay | Admitting: Internal Medicine

## 2019-10-07 DIAGNOSIS — G8929 Other chronic pain: Secondary | ICD-10-CM

## 2019-10-07 MED ORDER — GABAPENTIN 100 MG PO CAPS: ORAL_CAPSULE | ORAL | 2 refills | Status: DC

## 2019-10-07 NOTE — Telephone Encounter (Signed)
New prescription sent. Thank you

## 2019-10-07 NOTE — Telephone Encounter (Signed)
Called pt - stated the doctor he saw stated he will increase dose of Gabapentin but new rx was not at the pharmacy. Pt saw Dr Tarri Abernethy 12/08 -    "I would like you to increase her gabapentin to 300 mg at night, 100 mg with breakfast, and 100 mg with lunch. ".  Thanks

## 2019-10-07 NOTE — Telephone Encounter (Signed)
Needs refill on gabapentin (NEURONTIN) 100 MG capsule  ;pt contact [phone]   [id number] - [place], [place]

## 2019-10-22 ENCOUNTER — Ambulatory Visit (INDEPENDENT_AMBULATORY_CARE_PROVIDER_SITE_OTHER): Payer: BC Managed Care – PPO | Admitting: Licensed Clinical Social Worker

## 2019-10-22 ENCOUNTER — Encounter: Payer: Self-pay | Admitting: Licensed Clinical Social Worker

## 2019-10-22 ENCOUNTER — Other Ambulatory Visit: Payer: Self-pay

## 2019-10-22 DIAGNOSIS — F319 Bipolar disorder, unspecified: Secondary | ICD-10-CM
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
## 2019-11-03 ENCOUNTER — Other Ambulatory Visit: Payer: Self-pay

## 2019-11-03 ENCOUNTER — Ambulatory Visit (INDEPENDENT_AMBULATORY_CARE_PROVIDER_SITE_OTHER): Payer: BC Managed Care – PPO | Admitting: Licensed Clinical Social Worker

## 2019-11-03 DIAGNOSIS — F319 Bipolar disorder, unspecified: Secondary | ICD-10-CM

## 2019-11-03 NOTE — BH Specialist Note (Signed)
Integrated Behavioral Health Initial Visit  MRN: [id number] Name: Denis Carreon  Number of Integrated Behavioral Health Clinician visits:: 2/6 Session Start time: 2:25  Session End time: 3:00 Total time: 35  minutes  Type of Service: Integrated Behavioral Health- Individual Interpretor:No.         SUBJECTIVE: Kamilo Och is a 29 y.o. male  whom attended the session indivdiually. Patient was referred by Dr.Helberg for depression and anxiety. Patient reports the following symptoms/concerns: depression, history of trauma, health issues, and anxiety.  Duration of problem: most of his life, but increased 7 years ago; Severity of problem: moderate  OBJECTIVE: Mood: netural and Affect: Constricted Risk of harm to self or others: No plan to harm self or others  LIFE CONTEXT: Family and Social: Patient is married, with one child, and one on the way.  Self-Care: Patient wants to improve his eating patterns.  Life Changes: Patient is currently experiencing some pain, and this is a trigger for his anxiety.   GOALS ADDRESSED: Patient will: 1. Reduce symptoms of: anxiety, depression and stress 2. Increase knowledge and/or ability of: coping skills, healthy habits, self-management skills and stress reduction  3. Demonstrate ability to: Increase healthy adjustment to current life circumstances, Increase adequate support systems for patient/family and Increase motivation to adhere to plan of care  INTERVENTIONS: Interventions utilized: Motivational Interviewing, Behavioral Activation, Brief CBT and Supportive Counseling  Standardized Assessments completed: assessed for SI, HI, and self-harm.  ASSESSMENT: Patient currently experiencing moderate levels of anxiety and depression. Patient has issues with appetite, sleep, fatigue, worry, lack of motivation, fears that something bad might happen, social anxiety, and periods of sadness. Patient learned how to implement CBT today to challenge negative   thought patterns that lead him to eating fast food or avoidance. Patient identified physical pain as a trigger for his anxiety/depression.     Patient may benefit from counseling.  PLAN: 1. Follow up with behavioral health clinician on : two weeks.   [name], [facility], LCAS

## 2019-11-06 ENCOUNTER — Ambulatory Visit (INDEPENDENT_AMBULATORY_CARE_PROVIDER_SITE_OTHER): Payer: BC Managed Care – PPO | Admitting: Internal Medicine

## 2019-11-06 DIAGNOSIS — R1012 Left upper quadrant pain: Secondary | ICD-10-CM

## 2019-11-06 DIAGNOSIS — R109 Unspecified abdominal pain: Secondary | ICD-10-CM | POA: Insufficient documentation

## 2019-11-06 DIAGNOSIS — R140 Abdominal distension (gaseous): Secondary | ICD-10-CM | POA: Diagnosis not present

## 2019-11-06 MED ORDER — DULOXETINE HCL 20 MG PO CPEP: ORAL_CAPSULE | ORAL | 0 refills | Status: DC

## 2019-11-06 NOTE — Patient Instructions (Signed)
Thank you, Paul Jarvis for allowing [place] to provide your care today. Today we discussed abdominal pain.    I have ordered CMP and lipase labs for you. I will call if any are abnormal.    I have place a referrals to none.   I have ordered the following tests: none   I have ordered the following medication/changed the following medications: 1. I changed your duloxetine medication to 20 mg. See AVS for details 2. Benefiber 3. Pepto as needed 4. Imodium as needed  Please follow-up in 3 weeks.    Should you have any questions or concerns please call the internal medicine clinic at [phone].    Paul Jarvis, D.O. [facility] Internal Medicine
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
## 2019-11-06 NOTE — Assessment & Plan Note (Addendum)
Assessment: Patient presents with LUQ abdominal pain that is apparently worse in the mornings. He feels bloated and feels like his left side of his belly appears larger. He describes it as an aching pain. He feels like his symptoms improve with BM. He states that this has been going on since late 2019. Has a BM 3 times a day with watery stools recently. He is not sure exactly how is stool were previously. He has not tried any OTC medications to make it feel better. He has tried CKLS enzymes. He does not believe it is related to his food/diet. He think it may be related to his duloxetine medication.  Feels like burping relieves the pain. He denies nausea or vomiting. He denies ETOH or illicit drug use. Differential included medication side effect, IBS, SIBO.  Plan: - Ordered CMP and lipase - Taper Duloxetine to discontinue medication - Use imodium as needed to decrease number of stools. - Keep a food journal  - Try to increase fiber in diet with benefiber

## 2019-11-07 LAB — CMP14 + ANION GAP
ALT: 23 IU/L (ref 0–44)
AST: 22 IU/L (ref 0–40)
Albumin/Globulin Ratio: 2.4 — ABNORMAL HIGH (ref 1.2–2.2)
Albumin: 4.7 g/dL (ref 4.1–5.2)
Alkaline Phosphatase: 69 IU/L (ref 39–117)
Anion Gap: 14 mmol/L (ref 10.0–18.0)
BUN/Creatinine Ratio: 10 (ref 9–20)
BUN: 12 mg/dL (ref 6–20)
Bilirubin Total: 0.3 mg/dL (ref 0.0–1.2)
CO2: 23 mmol/L (ref 20–29)
Calcium: 9.6 mg/dL (ref 8.7–10.2)
Chloride: 104 mmol/L (ref 96–106)
Creatinine, Ser: 1.21 mg/dL (ref 0.76–1.27)
GFR calc Af Amer: 92 mL/min/{1.73_m2} (ref >59)
GFR calc non Af Amer: 80 mL/min/{1.73_m2} (ref >59)
Globulin, Total: 2 g/dL (ref 1.5–4.5)
Glucose: 87 mg/dL (ref 65–99)
Potassium: 4.4 mmol/L (ref 3.5–5.2)
Sodium: 141 mmol/L (ref 134–144)
Total Protein: 6.7 g/dL (ref 6.0–8.5)

## 2019-11-07 LAB — LIPASE: Lipase: 23 U/L (ref 13–78)

## 2019-11-10 ENCOUNTER — Encounter: Payer: Self-pay | Admitting: Internal Medicine

## 2019-11-11 NOTE — Progress Notes (Signed)
Internal Medicine Clinic Attending  Case discussed with Dr. Marchia Bond at the time of the visit.  We reviewed the resident's history and exam and pertinent patient test results.  I agree with the assessment, diagnosis, and plan of care documented in the resident's note.   Patient presents with complaint of abdominal pain that he feels is a side effect of duloxetine. Onset of symptoms correlate with starting the medication. GI side effects are apparently reported in 5% of patients on duloxetine.  Agree with taper and plan to monitor symptoms off medication. Also high on my differential is IBS.

## 2019-11-13 DIAGNOSIS — F3181 Bipolar II disorder: Secondary | ICD-10-CM | POA: Diagnosis not present

## 2019-11-13 DIAGNOSIS — F411 Generalized anxiety disorder: Secondary | ICD-10-CM | POA: Diagnosis not present

## 2019-11-17 ENCOUNTER — Ambulatory Visit (INDEPENDENT_AMBULATORY_CARE_PROVIDER_SITE_OTHER): Payer: BC Managed Care – PPO | Admitting: Licensed Clinical Social Worker

## 2019-11-17 ENCOUNTER — Other Ambulatory Visit: Payer: Self-pay

## 2019-11-17 DIAGNOSIS — F319 Bipolar disorder, unspecified: Secondary | ICD-10-CM

## 2019-11-18 ENCOUNTER — Encounter: Payer: Self-pay | Admitting: Licensed Clinical Social Worker

## 2019-11-18 NOTE — BH Specialist Note (Signed)
Integrated Behavioral Health Follow Up Visit  MRN: [id number] Name: Paul Jarvis  Number of Integrated Behavioral Health Clinician visits: 3/6 Session Start time: 1:10  Session End time: 2;00 Total time: 50  minutes  Type of Service: Integrated Behavioral Health- Individual Interpretor:No.   SUBJECTIVE: Paul Jarvis is a 29 y.o. male  whom attended the session indivdiually.  Patient was referred by Dr. Caron Presume for depression and anxiety. Patient reports the following symptoms/concerns: depression, history of trauma, health issues, and anxiety.  Duration of problem: most of his life, but has increased over the past 7 years; Severity of problem: moderate  OBJECTIVE: Mood: Netural and Affect: Appropriate Risk of harm to self or others: No plan to harm self or others  LIFE CONTEXT: Family and Social: Patient's wife is pregnant with his third child. Patient identified goals to improve socialization with others.  School/Work: Reported challenges focusing on his school work due to depression/anxiety. Self-Care: Working on reducing negative coping skills of eating fast food to comfort him.   GOALS ADDRESSED: Patient will: 1.  Reduce symptoms of: agitation, anxiety, depression and stress  2.  Increase knowledge and/or ability of: coping skills, healthy habits, self-management skills and stress reduction  3.  Demonstrate ability to: Increase healthy adjustment to current life circumstances, Increase adequate support systems for patient/family and Increase motivation to adhere to plan of care  INTERVENTIONS: Interventions utilized:  Motivational Interviewing, Solution-Focused Strategies, Mindfulness or Management consultant, Brief CBT, Supportive Counseling and Medication Monitoring Standardized Assessments completed: assessed for SI, HI, and self-harm.  ASSESSMENT: Patient currently experiencing moderate levels of anxiety and depression. Patient has decreased the amount of times he visits fast  food restaurant, and is using the tools of CBT to ask himself what he needs emotionally verses fulfilling with food.  Communication patterns were discussed, and information was provided to assist the patient in transitioning from unhealthy communication patterns to assertive communication. Patient identified triggers for depression/furstration: being misunderstood, in pain, fears of abandonment, and insecurities. Patient identified that he has an unhealthy tendency to people please. This theory was explored in session.   Patient may benefit from counseling and continuing to meet with his psychiatrist.  PLAN: 1. Follow up with behavioral health clinician on : two weeks.   [name], [facility], LCAS

## 2019-12-02 ENCOUNTER — Ambulatory Visit (INDEPENDENT_AMBULATORY_CARE_PROVIDER_SITE_OTHER): Payer: BC Managed Care – PPO | Admitting: Licensed Clinical Social Worker

## 2019-12-02 ENCOUNTER — Encounter: Payer: Self-pay | Admitting: Licensed Clinical Social Worker

## 2019-12-02 ENCOUNTER — Other Ambulatory Visit: Payer: Self-pay

## 2019-12-02 DIAGNOSIS — F319 Bipolar disorder, unspecified: Secondary | ICD-10-CM
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
## 2019-12-22 ENCOUNTER — Ambulatory Visit (INDEPENDENT_AMBULATORY_CARE_PROVIDER_SITE_OTHER): Payer: BC Managed Care – PPO | Admitting: Licensed Clinical Social Worker

## 2019-12-22 ENCOUNTER — Encounter: Payer: Self-pay | Admitting: Licensed Clinical Social Worker

## 2019-12-22 DIAGNOSIS — F319 Bipolar disorder, unspecified: Secondary | ICD-10-CM
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
## 2020-01-05 ENCOUNTER — Ambulatory Visit (INDEPENDENT_AMBULATORY_CARE_PROVIDER_SITE_OTHER): Payer: BC Managed Care – PPO | Admitting: Licensed Clinical Social Worker

## 2020-01-05 ENCOUNTER — Encounter: Payer: Self-pay | Admitting: Licensed Clinical Social Worker

## 2020-01-05 DIAGNOSIS — F319 Bipolar disorder, unspecified: Secondary | ICD-10-CM
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
## 2020-01-05 NOTE — BH Specialist Note (Signed)
Integrated Behavioral Health Follow Up Visit  MRN: [id number] Name: Paul Jarvis  Number of Integrated Behavioral Health Clinician visits: 6/6 Session Start time: 10:00  Session End time: 10:30 Total time: 30 minutes  Type of Service: Integrated Behavioral Health- Individual Interpretor:No.   SUBJECTIVE: Paul Jarvis is a 29 y.o. male accompanied by whom attended the session individually.  Patient was referred by Dr.Helberg for depression and anxiety. Patient reports the following symptoms/concerns: depression, mood instability, physical pain, history of trauma, health issues, and anxiety.  Duration of problem: most of his life, increased over the past seven years; Severity of problem: moderate  OBJECTIVE: Mood: Dysphoric and Hopeless and Affect: Depressed and Tearful Risk of harm to self or others: No plan to harm self or others  LIFE CONTEXT: Family and Social: Patient's wife is about to have their third child.   GOALS ADDRESSED: Patient will: 1.  Reduce symptoms of: anxiety, depression, mood instability and stress  2.  Increase knowledge and/or ability of: coping skills, healthy habits, self-management skills and stress reduction  3.  Demonstrate ability to: Increase healthy adjustment to current life circumstances, Increase adequate support systems for patient/family and Improve medication compliance  INTERVENTIONS: Interventions utilized:  Behavioral Activation, Brief CBT, Supportive Counseling and Medication Monitoring Standardized Assessments completed: assessed for SI, HI, and self-harm.  ASSESSMENT: Patient currently experiencing increased levels of depression from the last session. Patient reported that he stopped taking his psychotropic medications several weeks ago due to his doctor "telling him that they could be causing stomach related issues".  Patient's self-defeating thoughts have increased over the past two sessions. Patient reported that his increased pain, is one  of the main triggers for his anxiety and depression. Patient is hopeful that he can have surgery, and this will reduce his depression. Patient has all-or-nothing thinking patterns that lead to increased negative thoughts.   Patient agreed to discuss with his PCP at his appointment on Thursday his medication changes that occurred at the last visit, and what options he might have.  Patient may benefit from counseling.  PLAN: 1. Follow up with behavioral health clinician on : three weeks due to the patient having a new baby. 2. Behavioral recommendations: Get a future appointment with his psychiatrist.   [name], [facility] , LCAS

## 2020-01-07 ENCOUNTER — Ambulatory Visit (INDEPENDENT_AMBULATORY_CARE_PROVIDER_SITE_OTHER): Payer: BC Managed Care – PPO | Admitting: Internal Medicine

## 2020-01-07 VITALS — BP 146/89 | HR 105 | Temp 98.7°F | Ht 69.0 in | Wt 241.6 lb

## 2020-01-07 DIAGNOSIS — N50812 Left testicular pain: Secondary | ICD-10-CM

## 2020-01-07 DIAGNOSIS — M545 Low back pain: Secondary | ICD-10-CM

## 2020-01-07 DIAGNOSIS — G8929 Other chronic pain: Secondary | ICD-10-CM

## 2020-01-07 DIAGNOSIS — M797 Fibromyalgia: Secondary | ICD-10-CM | POA: Diagnosis not present

## 2020-01-07 DIAGNOSIS — R350 Frequency of micturition: Secondary | ICD-10-CM

## 2020-01-07 DIAGNOSIS — M79605 Pain in left leg: Secondary | ICD-10-CM

## 2020-01-07 DIAGNOSIS — M25552 Pain in left hip: Secondary | ICD-10-CM

## 2020-01-07 DIAGNOSIS — I10 Essential (primary) hypertension: Secondary | ICD-10-CM

## 2020-01-07 MED ORDER — CYCLOBENZAPRINE HCL 5 MG PO TABS: 5.0000 mg | ORAL_TABLET | Freq: Three times a day (TID) | ORAL | 0 refills | Status: DC | PRN

## 2020-01-07 NOTE — Patient Instructions (Addendum)
It was great meeting you today. Unfortunately, I do not have any really clear answers for you here today. I would like you to try flexeril for you pain and let me know how it works. I have also placed a referral to sports medicine and physical therapy. 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 This is basically an irritation of that nerve that is often worsened with wearing belts. I would recommend you try to wear loose fitting pants for a while. Typically this can take a while to calm down but should improve slowly.  I would like to see you back in 4 weeks or so and we can discuss how the flexeril is working for you and consider adding on another medication called amitriptyline.

## 2020-01-08 NOTE — Progress Notes (Signed)
Patient requested a male physician for evaluation of chronic testicular pain.  Exam was accompanied by nursing assistant as a chaperone. Briefly, the patient is concerned for 3-year history of left-sided back pain radiating into his left testicle.  He often pulls on his left testicle to relieve the pain which does not result in relief.  He denies specific nodular deformity or point tenderness of the left testicle or surrounding area.  He denies fever, chills, urinary discharge, weakness, numbness, tingling of the distal extremities or loss of bowel control.  He endorses increased urinary frequency.  Exam: Left and right testicles are unremarkable on exam.  There is no apparent tenderness to the superior portion of the testicles bilaterally.  There is no lump, mass or other abnormality noted of either testicle.  Observation of the testicles, scrotum and surrounding skin does not reveal  edema, rash or erythema. My exam is only positive for tenderness of the left SI joint and markedly decreased internal rotation of the left hip.  See accompanying note from Dr. Ephriam Knuckles for complete evaluation, assessment and plan.  My findings were discussed with Dr. Ephriam Knuckles and Dr. Criselda Peaches.

## 2020-01-12 ENCOUNTER — Encounter: Payer: Self-pay | Admitting: Internal Medicine

## 2020-01-12 LAB — HLA-B27 ANTIGEN: HLA B27: NEGATIVE

## 2020-01-12 LAB — SEDIMENTATION RATE: Sed Rate: 2 mm/hr (ref 0–15)

## 2020-01-12 LAB — C-REACTIVE PROTEIN: CRP: 1 mg/L (ref 0–10)

## 2020-01-12 NOTE — Assessment & Plan Note (Signed)
Chronic pain Pt has been seen in our office multiple times for pain. Depression (bipolar disorder) vs fibromyalgia. It seems like he has experienced minimal relief so far through [employer]. He is currently seeing ashton with behavior health as well as at the neuropsych clinic. At last office visit in January for abdominal pain, appears that his duloxetine was tapered down due to GI side effects which have since improved. Left hip and testicular pain. This is also part of his chronic pain however acutely worsened over the past week.  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 I encouraged him to avoid wearing tight pants or belts. We discussed that this can take a while to improve if it is the culprit, and that he should not expect immediate improvement.  The nature of his lower back and left hip pain do not raise any red flags. He has had numerous imaging of multiple joints which have been negative. I did obtain ESR, CRP, HLA B27 markers at this appt which were negative. No concerning aspects of history or PE to suggest further workup of his testicular pain at this time. He is encouraged to continue to monitor for changes however and to let me know if noted. Assessment At this point, I suspect fibromyalgia. Hady and I discussed this and, although he did not entirely reject the idea, he implied some resistance to the idea. We discussed pharmaceutical options which are primarily duloxetine and TCAs. As noted above, he did not tolerate duloxetine due to GI side effects. He was not interested in trying a TCA. He has also tried several second line agents without success. We discussed other modalities including sleep hygeine, exercise, meditation, acupuncture, CBT  Plan He agreed to a physical medicine and PT referral which I placed.  I also gave him a trial of flexeril to see if we could improve his pain at all through  that. I encouraged him to follow up in our clinic in 6-8w to re-evaluate his symptoms.

## 2020-01-13 DIAGNOSIS — M545 Low back pain: Secondary | ICD-10-CM | POA: Diagnosis not present

## 2020-01-13 DIAGNOSIS — M25552 Pain in left hip: Secondary | ICD-10-CM | POA: Diagnosis not present

## 2020-01-19 NOTE — Progress Notes (Signed)
Internal Medicine Clinic Attending  I saw and evaluated the patient.  I personally confirmed the key portions of the history and exam documented by Dr. Christian   and I reviewed pertinent patient test results.  The assessment, diagnosis, and plan were formulated together and I agree with the documentation in the resident's note.  

## 2020-01-27 ENCOUNTER — Ambulatory Visit (INDEPENDENT_AMBULATORY_CARE_PROVIDER_SITE_OTHER): Payer: BC Managed Care – PPO | Admitting: Licensed Clinical Social Worker

## 2020-01-27 DIAGNOSIS — F319 Bipolar disorder, unspecified: Secondary | ICD-10-CM

## 2020-01-27 NOTE — BH Specialist Note (Signed)
Integrated Behavioral Health Follow Up Visit  MRN: [id number] Name: Paul Jarvis  Number of Integrated Behavioral Health Clinician visits: 7 Session Start time: 12:00  Session End time: 12:30 Total time: 30 minutes  Type of Service: Integrated Behavioral Health- Individual Interpretor:No.   SUBJECTIVE: Paul Jarvis is a 29 y.o. male  whom attended the session individually. Patient was referred by Dr. Caron Presume for depression and anxiety. Patient reports the following symptoms/concerns: depression, mood instability, physical pain, history of trauma, health issues, and anxiety.  Duration of problem: most of his life; Severity of problem: moderate  OBJECTIVE: Mood: Netural and Affect: Appropriate Risk of harm to self or others: No plan to harm self or others  LIFE CONTEXT: Family and Social:Patient's wife just had their third son, and the patient is adjusting well so far to the new addition to their family.  Self-Care: Continuing to work on improving.   GOALS ADDRESSED: Patient will: 1.  Reduce symptoms of: anxiety, depression, mood instability and stress  2.  Increase knowledge and/or ability of: coping skills, healthy habits, self-management skills and stress reduction  3.  Demonstrate ability to: Increase healthy adjustment to current life circumstances, Increase adequate support systems for patient/family and Improve medication compliance  INTERVENTIONS: Interventions utilized:  Brief CBT and Supportive Counseling Standardized Assessments completed: assessed for SI, HI, and self-harm.  ASSESSMENT: Patient currently experiencing decreased slightly since the last session. Patient is using CBT regularly to challenge negative self-talk. Over time the patient has displayed a correlation between pain and depression/anxiety. Due to a decrease in pain recently the patient's depression has decreased. Patient is continuing to learn more about mindfulness, and implement being present/grounding  techniques.  Patient may benefit from counseling and medication management.  PLAN: 1. Follow up with behavioral health clinician on : two weeks.   [name], [facility], LCAS

## 2020-01-30 ENCOUNTER — Encounter: Payer: Self-pay | Admitting: Licensed Clinical Social Worker

## 2020-02-05 DIAGNOSIS — F3181 Bipolar II disorder: Secondary | ICD-10-CM | POA: Diagnosis not present

## 2020-02-05 DIAGNOSIS — F411 Generalized anxiety disorder: Secondary | ICD-10-CM | POA: Diagnosis not present

## 2020-02-08 ENCOUNTER — Encounter: Payer: Self-pay | Admitting: Internal Medicine

## 2020-02-16 ENCOUNTER — Encounter: Payer: Self-pay | Admitting: Licensed Clinical Social Worker

## 2020-02-16 ENCOUNTER — Ambulatory Visit (INDEPENDENT_AMBULATORY_CARE_PROVIDER_SITE_OTHER): Payer: BC Managed Care – PPO | Admitting: Licensed Clinical Social Worker

## 2020-02-16 DIAGNOSIS — F319 Bipolar disorder, unspecified: Secondary | ICD-10-CM

## 2020-02-16 NOTE — BH Specialist Note (Signed)
Integrated Behavioral Health Follow Up Visit  MRN: [id number] Name: Paul Jarvis  Number of Integrated Behavioral Health Clinician visits: 8 Session Start time: 11:15  Session End time: 12:00 Total time: 45  minutes  Type of Service: Integrated Behavioral Health- Individual Interpretor:No.   SUBJECTIVE: Paul Jarvis is a 29 y.o. male  whom attended the session individually. Patient was referred by Dr. Caron Presume for depression and anxiety. Patient reports the following symptoms/concerns: depression, mood instability, physical pain, history of trauma, health issues, and anxiety.  Duration of problem: most of his life; Severity of problem: moderate  OBJECTIVE: Mood: Netural and Affect: Appropriate Risk of harm to self or others: No plan to harm self or others  LIFE CONTEXT: Family and Social: Patient processed his intense fear of rejection, stems from childhood. School/Work: Continuing to go to school. Currently slightly behind with school assignments.  Self-Care: improving.   GOALS ADDRESSED: Patient will: 1.  Reduce symptoms of: agitation, anxiety, depression, mood instability and stress  2.  Increase knowledge and/or ability of: coping skills, healthy habits, self-management skills and stress reduction  3.  Demonstrate ability to: Increase healthy adjustment to current life circumstances and Increase adequate support systems for patient/family  INTERVENTIONS: Interventions utilized:  Motivational Interviewing, Behavioral Activation, Brief CBT and Supportive Counseling Standardized Assessments completed: assessed for SI, HI, and self-harm.  ASSESSMENT: Patient currently experiencing mild to moderate levels of depression and anxiety. Patient processed continued issues with self-esteem. Patient reported feeling like an "outsider" with is family (mother, sisters, and father). Patient is improving ability to challenge negative thoughts. Patient is implementing healthier coping skills when  feeling anxious or depressed.   Patient may benefit from counseling.  PLAN: 1. Follow up with behavioral health clinician on : two weeks.  [name], [facility], LCAS

## 2020-02-18 ENCOUNTER — Ambulatory Visit: Payer: BC Managed Care – PPO | Admitting: Sports Medicine

## 2020-02-18 ENCOUNTER — Other Ambulatory Visit: Payer: Self-pay

## 2020-02-18 VITALS — BP 120/80 | Ht 69.5 in | Wt 240.0 lb

## 2020-02-18 DIAGNOSIS — M25559 Pain in unspecified hip: Secondary | ICD-10-CM | POA: Diagnosis not present

## 2020-02-18 MED ORDER — MELOXICAM 15 MG PO TABS: 15.0000 mg | ORAL_TABLET | Freq: Every day | ORAL | 0 refills | Status: DC | PRN

## 2020-02-18 NOTE — Progress Notes (Signed)
  Paul Jarvis - 29 y.o. male MRN [id number]  Date of birth: [birth year]  SUBJECTIVE:   CC: left hip pain  29 year old man presenting with intermittent left lateral hip and leg pain for the past several years.  He reports that he he feels pain over his lateral hip and it is sensitive to touch to the point where he has to loosen his belt when he sits.  He feels the pain extend into the outside of his leg. Pain is present in all positions. He was treated for sciatica on his left side in October 2020, which improved with prednisone, gabapentin, and rehabilitation exercises.  He reports that that the pain he is having today is different from his sciatic pain.  He also reports occasional pain radiating to his left testicle when he tenses up. No numbness/tingling down his outer leg or posterior leg. No change in bowel or bladder function.    ROS: No unexpected weight loss, fever, chills, swelling, instability, muscle pain, numbness/tingling, redness, otherwise see HPI   PMHx - Updated and reviewed.  Contributory factors include: Negative PSHx - Updated and reviewed.  Contributory factors include:  Negative FHx - Updated and reviewed.  Contributory factors include:  Negative Social Hx - Updated and reviewed. Contributory factors include: Negative Medications - reviewed   DATA REVIEWED: Prior records  PHYSICAL EXAM:  VS: BP:120/80  HR: bpm  TEMP: ( )  RESP:   HT:5' 9.5" (176.5 cm)   WT:240 lb (108.9 kg)  BMI:34.95 PHYSICAL EXAM: Gen: NAD, alert, cooperative with exam, well-appearing HEENT: clear conjunctiva,  CV:  no edema, capillary refill brisk, normal rate Resp: non-labored Skin: no rashes, normal turgor  Neuro: no gross deficits.  Psych:  alert and oriented  Hip:  - Inspection: No gross deformity, no swelling, erythema, or ecchymosis - Palpation: TTP over lateral leg; IT band is palpably tense. No TTP over greater trochanter - ROM: Normal range of motion on Flexion, extension,  abduction, internal and external rotation - Strength: Hip abduction on left side is notably weaker than right side - Neuro/vasc: NV intact distally - Special Tests: Negative FABER and FADIR.  Negative Scour.  Positive Trendelenberg on left.  positive Ober's on left.   ASSESSMENT & PLAN:  29 year old male presenting with chronic intermittent lateral hip and leg pain. He has had thorough imaging recently due to sciatic pain and hip pain in the se past several years, including a hip x-ray that was normal and a lumbar MRI in November 2020 that showed mild disc protrusion at L5-S1. His findings appear to be consistent with a tight iliotibial band and pain is reproduced with Ober's testing.  Report of pressure of belt over the lateral hip causing pain down his lateral leg would not be from superficial cutaneous femoral nerve as this typically causes numbness but no pain. Occasional pain into testicle is unclear at this time- no clear source of pudendal nerve entrapment.  He reports that physical therapy is not financially feasible at this time, so will start with a home exercise program. Provided iliotibial band stretches, hip abduction strengthening, and a prescription for meloxicam. Return in 6 weeks for re-evaluation.  Patient seen and evaluated with the sports medicine fellow.  I agree with the above plan of care.  Treatment as above.  Follow-up in 6 weeks.  If symptoms persist, consider MRI of the left hip.

## 2020-03-01 ENCOUNTER — Ambulatory Visit (INDEPENDENT_AMBULATORY_CARE_PROVIDER_SITE_OTHER): Payer: BC Managed Care – PPO | Admitting: Licensed Clinical Social Worker

## 2020-03-01 DIAGNOSIS — F319 Bipolar disorder, unspecified: Secondary | ICD-10-CM

## 2020-03-02 NOTE — BH Specialist Note (Signed)
Integrated Behavioral Health Follow Up Visit  MRN: [id number] Name: Paul Jarvis  Number of Integrated Behavioral Health Clinician visits: 9 Session Start time: 12:00  Session End time: 12:30 Total time: 30  Type of Service: Integrated Behavioral Health- Individual Interpretor:No.   SUBJECTIVE: Paul Jarvis is a 29 y.o. male whom attended the session individually.  Patient was referred by Dr. Johnna Acosta for depression and anxiety. Patient reports the following symptoms/concerns: depression, mood instability, physical pain, history of trauma, health issues, and anxiety.  Duration of problem: most of his life; Severity of problem: moderate  OBJECTIVE: Mood: Negative and Affect: Appropriate Risk of harm to self or others: No plan to harm self or others  LIFE CONTEXT: Family and Social: Adjusting to having a new baby. Processed a strong desire to have his father be proud of him.  School/Work: Patient has a job interview, and processed his feelings towards this opportunity.  Self-Care: Needs to improve his ability to take medication regularly.   GOALS ADDRESSED: Patient will: 1.  Reduce symptoms of: agitation, anxiety, depression, mood instability and stress  2.  Increase knowledge and/or ability of: coping skills, healthy habits and stress reduction  3.  Demonstrate ability to: Increase healthy adjustment to current life circumstances, Increase adequate support systems for patient/family and Improve medication compliance  INTERVENTIONS: Interventions utilized:  Behavioral Activation, Brief CBT, Supportive Counseling and Medication Monitoring Standardized Assessments completed: assessed for SI, HI, and self-harm.  ASSESSMENT: Patient currently experiencing moderate levels of depression and anxiety. Patient processed his need to please others and be accepted. Patient has a fear of rejection that is partially due to trauma and his own negative beliefs about himself. Patient processed goals  to gain more stable employment and continue to be a good father.    Patient reported that he will come on and off his medications, and he was encouraged not to do this. Patient has a history of coming on and off of his mood medication, and understands that this can be a catalyst for further mood disruption.   Patient may benefit from counseling and continuing to see his psychiatrist.  PLAN: 1. Follow up with behavioral health clinician on :   [name], [facility], LCAS

## 2020-03-03 ENCOUNTER — Encounter: Payer: Self-pay | Admitting: Licensed Clinical Social Worker

## 2020-03-04 DIAGNOSIS — F411 Generalized anxiety disorder: Secondary | ICD-10-CM | POA: Diagnosis not present

## 2020-03-04 DIAGNOSIS — F3181 Bipolar II disorder: Secondary | ICD-10-CM | POA: Diagnosis not present

## 2020-03-31 ENCOUNTER — Ambulatory Visit: Payer: BC Managed Care – PPO | Admitting: Sports Medicine

## 2020-07-07 ENCOUNTER — Encounter: Payer: BC Managed Care – PPO | Admitting: Internal Medicine

## 2020-07-13 ENCOUNTER — Encounter: Payer: Self-pay | Admitting: Internal Medicine

## 2020-07-13 ENCOUNTER — Ambulatory Visit (INDEPENDENT_AMBULATORY_CARE_PROVIDER_SITE_OTHER): Payer: 59 | Admitting: Internal Medicine

## 2020-07-13 VITALS — BP 139/98 | HR 77 | Wt 244.0 lb

## 2020-07-13 DIAGNOSIS — M25562 Pain in left knee: Secondary | ICD-10-CM | POA: Diagnosis not present

## 2020-07-13 DIAGNOSIS — S8992XA Unspecified injury of left lower leg, initial encounter: Secondary | ICD-10-CM

## 2020-07-13 DIAGNOSIS — I10 Essential (primary) hypertension: Secondary | ICD-10-CM

## 2020-07-15 DIAGNOSIS — S8992XA Unspecified injury of left lower leg, initial encounter: Secondary | ICD-10-CM | POA: Insufficient documentation

## 2020-07-15 DIAGNOSIS — I10 Essential (primary) hypertension: Secondary | ICD-10-CM | POA: Insufficient documentation

## 2020-07-15 NOTE — Assessment & Plan Note (Signed)
Blood pressure is above goal at the office today. Chart review indicates elevated blood pressure reading at the past few visits.  Discussed lifestyle and pharmaceutical treatment options. Pt does not wish to start medicine at this time. He has been working on diet and exercise over the past 2 months and would like to continue to do so. Plan --lifestyle interventions only at this time. Discussed at this visit. --discussed that pharmaceutical intervention may be needed in the future  --f/u in 2 months for blood pressure recheck

## 2020-07-15 NOTE — Assessment & Plan Note (Signed)
Moderate effusion present on exam. No instability of the joint.  Primary concern is ligament tear given the persistent symptoms and effusion for 2 mo. Suspect he will need imaging. Referral placed to orthopedic surgery for further evaluation.

## 2020-07-15 NOTE — Progress Notes (Signed)
Office Visit   Patient ID: Paul Jarvis, male    DOB: [birth year], 29 y.o.   MRN: [id number]  Subjective:  CC: left knee pain  HPI 28 y.o. presents today for evaluation of 70mo history of left knee pain that began after hyperextending it when he stepped into a hold at work in July. He denies any immediate swelling or bruising and was able to apply weight to it. He did not hear any cracks or pops at the time of the incident. Since the injury, he has had persistent pain and swelling. He denies feeling any clicks or catches. Pain is worse with with standing. He has tried a knee brace and ibuprofen without significant relief.     ACTIVE MEDICATIONS   Current Outpatient Medications on File Prior to Visit  Medication Sig Dispense Refill  . carbamazepine (CARBATROL) 200 MG 12 hr capsule Take 200 mg by mouth at bedtime.    . cyclobenzaprine (FLEXERIL) 5 MG tablet Take 1-2 tablets (5-10 mg total) by mouth 3 (three) times daily as needed for muscle spasms. 30 tablet 0  . meloxicam (MOBIC) 15 MG tablet Take 1 tablet (15 mg total) by mouth daily as needed for pain. Take with food. 30 tablet 0   No current facility-administered medications on file prior to visit.    ROS  Review of Systems  Constitutional: Negative for chills and fever.  Respiratory: Negative for shortness of breath.   Cardiovascular: Negative for chest pain.  Musculoskeletal: Positive for arthralgias and joint swelling.    Objective:   BP (!) 139/98 (BP Location: Right Arm)   Pulse 77   Wt 244 lb (110.7 kg)   SpO2 99%   BMI 35.52 kg/m  Wt Readings from Last 3 Encounters:  07/13/20 244 lb (110.7 kg)  02/18/20 240 lb (108.9 kg)  01/07/20 241 lb 9.6 oz (109.6 kg)   BP Readings from Last 3 Encounters:  07/13/20 (!) 139/98  02/18/20 120/80  01/07/20 (!) 146/89   Physical Exam Cardiovascular:     Rate and Rhythm: Normal rate and regular rhythm.  Pulmonary:     Effort: Pulmonary effort is normal.     Breath sounds:  Normal breath sounds.  Musculoskeletal:     Comments: Left knee: no obvious deformity. No bruising.  Moderate effusion present with patellar bounce. Pain with palpation of proximal knee joint. No pain on palpation of medial or lateral knee. ROM intact but with pain. Negative anterior drawer. Negative lachman's test.      Health Maintenance:   Health Maintenance  Topic Date Due  . Hepatitis C Screening  Never done  . COVID-19 Vaccine (1) Never done  . INFLUENZA VACCINE  07/15/2021 (Originally 05/15/2020)  . TETANUS/TDAP  11/16/2025  . HIV Screening  Completed     Assessment & Plan:   Problem List Items Addressed This Visit      Cardiovascular and Mediastinum   Hypertension    Blood pressure is above goal at the office today. Chart review indicates elevated blood pressure reading at the past few visits.  Discussed lifestyle and pharmaceutical treatment options. Pt does not wish to start medicine at this time. He has been working on diet and exercise over the past 2 months and would like to continue to do so. Plan --lifestyle interventions only at this time. Discussed at this visit. --discussed that pharmaceutical intervention may be needed in the future  --f/u in 2 months for blood pressure recheck  Other   Left knee injury, initial encounter    Moderate effusion present on exam. No instability of the joint.  Primary concern is ligament tear given the persistent symptoms and effusion for 2 mo. Suspect he will need imaging. Referral placed to orthopedic surgery for further evaluation.       Other Visit Diagnoses    Acute pain of left knee    -  Primary   Relevant Orders   Ambulatory referral to Orthopedic Surgery        Pt discussed with Dr. Fredrich Romans, MD Internal Medicine Resident PGY-2 [facility] Pager: [phone] 07/15/2020 8:09 AM

## 2020-07-18 NOTE — Progress Notes (Signed)
Internal Medicine Clinic Attending Internal Medicine Clinic Attending  Case discussed with Dr. Ephriam Knuckles  At the time of the visit.  We reviewed the resident's history and exam and pertinent patient test results.  I agree with the assessment, diagnosis, and plan of care documented in the resident's note.

## 2020-08-02 ENCOUNTER — Encounter: Payer: Self-pay | Admitting: Orthopaedic Surgery

## 2020-08-02 ENCOUNTER — Ambulatory Visit: Payer: Self-pay

## 2020-08-02 ENCOUNTER — Ambulatory Visit (INDEPENDENT_AMBULATORY_CARE_PROVIDER_SITE_OTHER): Payer: 59 | Admitting: Orthopaedic Surgery

## 2020-08-02 ENCOUNTER — Other Ambulatory Visit: Payer: Self-pay

## 2020-08-02 DIAGNOSIS — M25562 Pain in left knee: Secondary | ICD-10-CM

## 2020-08-02 DIAGNOSIS — M25552 Pain in left hip: Secondary | ICD-10-CM | POA: Diagnosis not present

## 2020-08-02 DIAGNOSIS — M5442 Lumbago with sciatica, left side: Secondary | ICD-10-CM

## 2020-08-02 MED ORDER — TIZANIDINE HCL 2 MG PO CAPS: 2.0000 mg | ORAL_CAPSULE | Freq: Two times a day (BID) | ORAL | 1 refills | Status: DC | PRN
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
## 2020-08-05 ENCOUNTER — Ambulatory Visit: Payer: 59 | Admitting: Family Medicine

## 2020-08-09 ENCOUNTER — Ambulatory Visit: Payer: Self-pay

## 2020-08-09 ENCOUNTER — Other Ambulatory Visit: Payer: Self-pay

## 2020-08-09 ENCOUNTER — Ambulatory Visit (INDEPENDENT_AMBULATORY_CARE_PROVIDER_SITE_OTHER): Payer: 59 | Admitting: Family Medicine

## 2020-08-09 ENCOUNTER — Encounter: Payer: Self-pay | Admitting: Family Medicine

## 2020-08-09 DIAGNOSIS — M25552 Pain in left hip: Secondary | ICD-10-CM | POA: Diagnosis not present
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
## 2020-08-09 NOTE — Patient Instructions (Signed)
   Vitamin D3:  Take 5,000 IU daily  Glucosamine Sulfate:  Take 1,000 mg twice daily  Turmeric:  500 mg twice daily

## 2020-09-08 IMAGING — MR MR LUMBAR SPINE W/O CM
4 of 5 series · 19 of 48 positions shown · non-contrast
Comparison: MRI 03/27/2019, x-ray 01/03/2018

CLINICAL DATA: Chronic left-sided low back pain. Urinary
incontinence

EXAM:
MRI LUMBAR SPINE WITHOUT CONTRAST
TECHNIQUE: Multiplanar, multisequence MR imaging of the lumbar spine was
performed. No intravenous contrast was administered.

[Series 3: T2 · sagittal · 4.0mm · 0.55mm/px · 6 of 13 slices shown (1 of 2)]
[im 1/13]
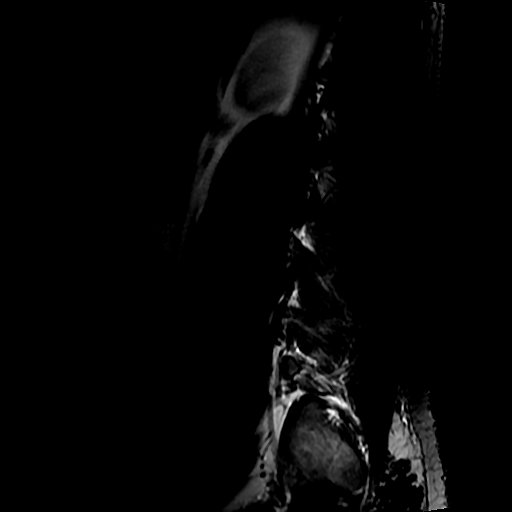
[im 3/13]
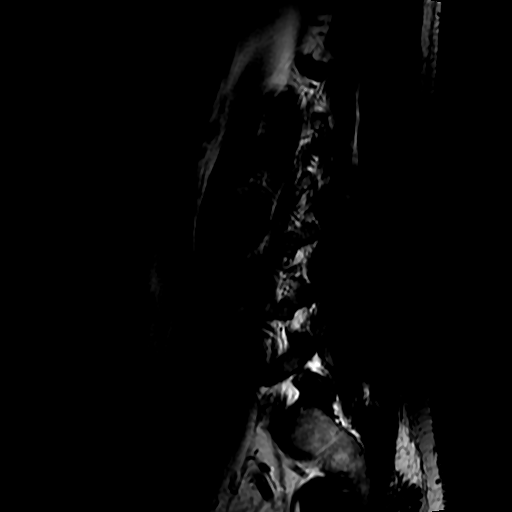
[im 5/13]
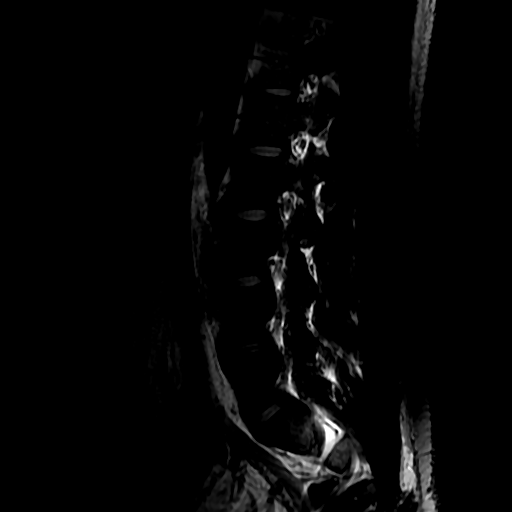
[im 8/13]
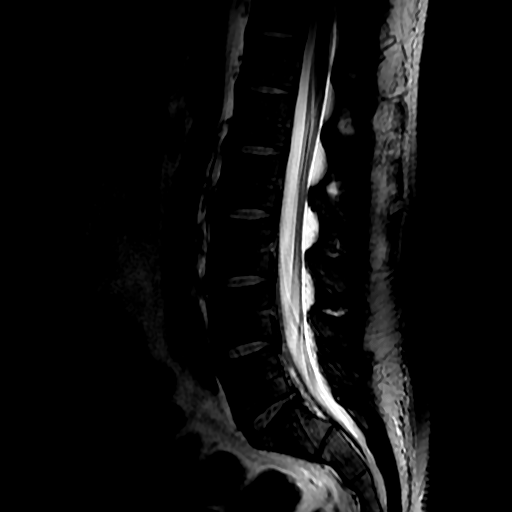
[im 10/13]
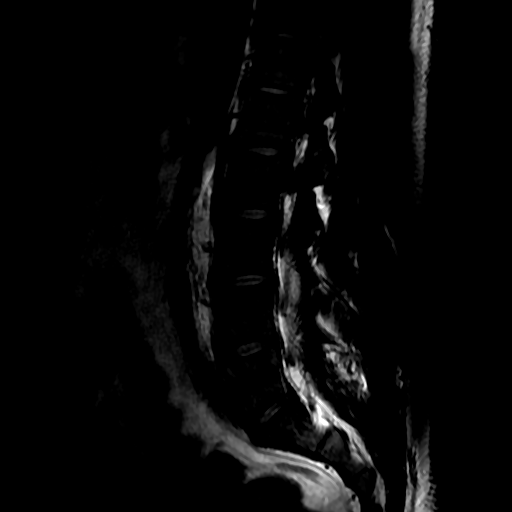
[im 13/13]
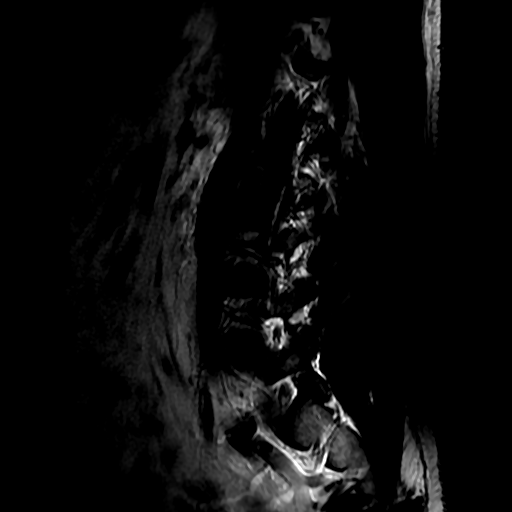

[Series 5: T1 · sagittal · 4.0mm · 0.55mm/px · 3 of 13 slices shown (1 of 2)]
[im 3/13]
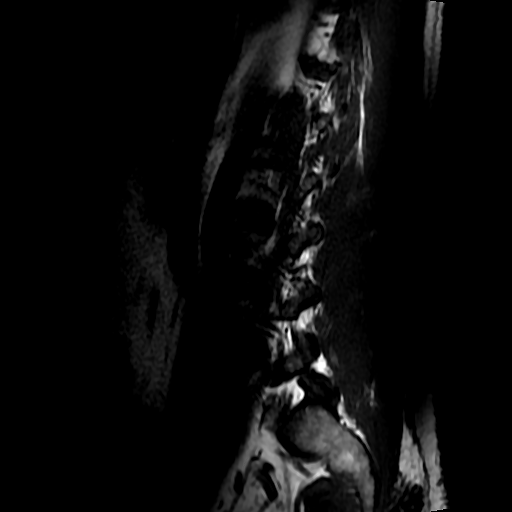
[im 8/13]
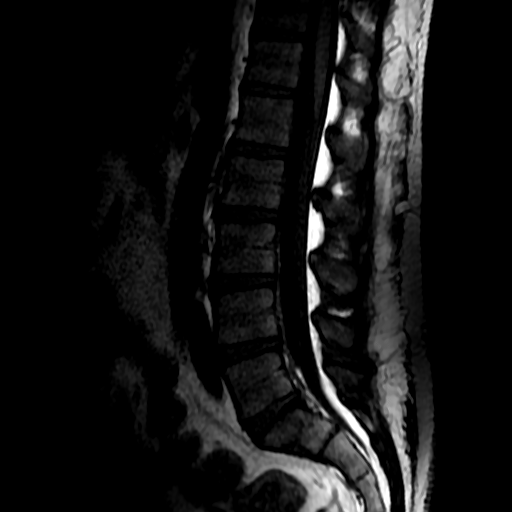
[im 13/13]
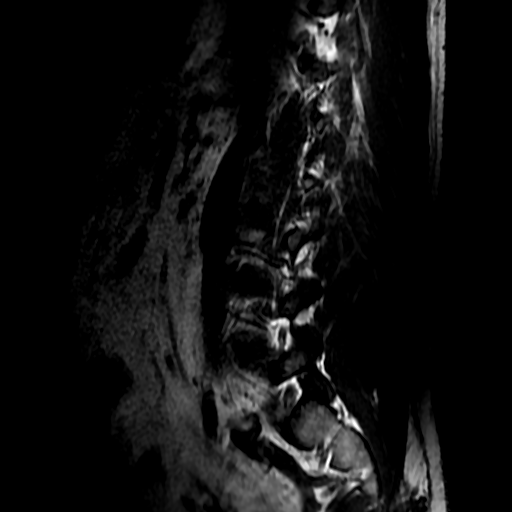

[Series 6: T2 · axial · 4.0mm · 0.39mm/px · z∈[-166,-2]mm · 7 of 34 slices shown (2 of 2)]
[im 1/34]
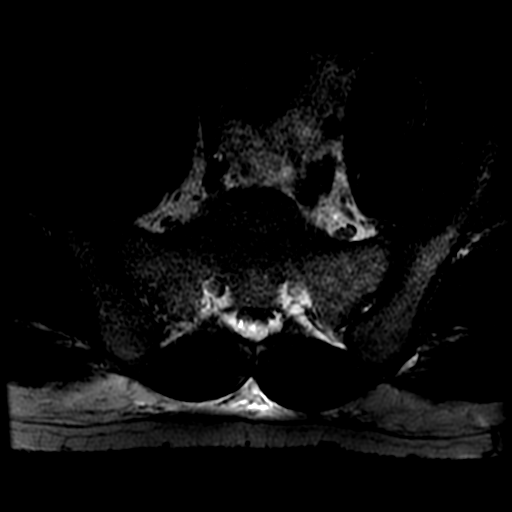
[im 5/34]
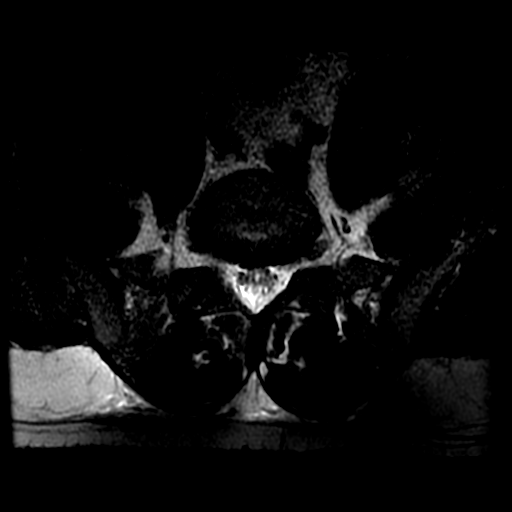
[im 10/34]
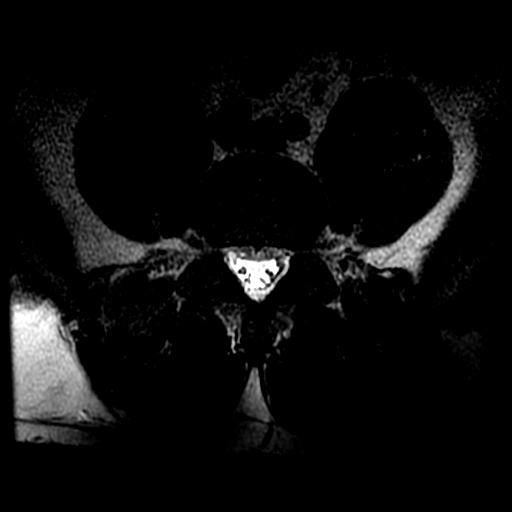
[im 15/34]
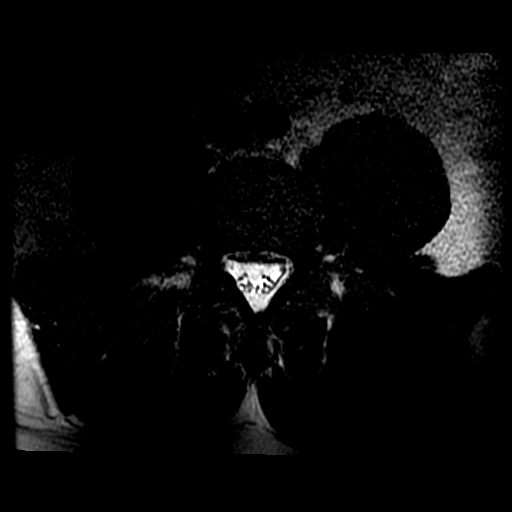
[im 17/34]
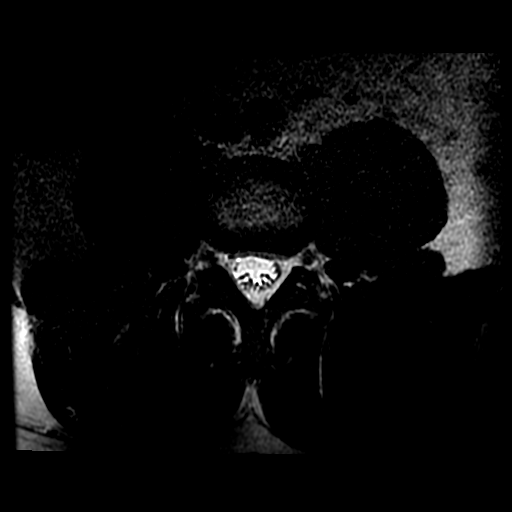
[im 19/34]
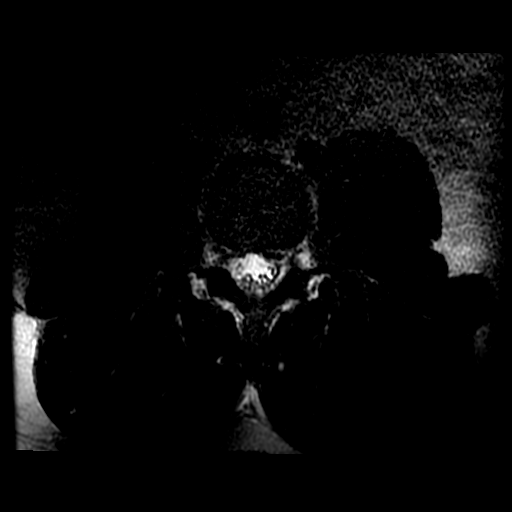
[im 29/34]
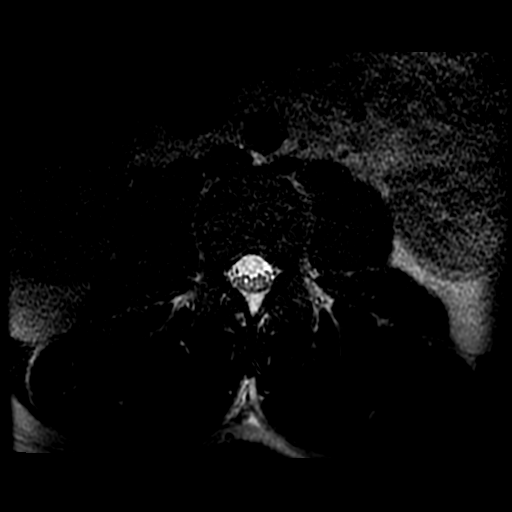

[Series 7: T1 · axial · 4.0mm · 0.39mm/px · z∈[-146,-2]mm · 3 of 34 slices shown (2 of 2)]
[im 5/34]
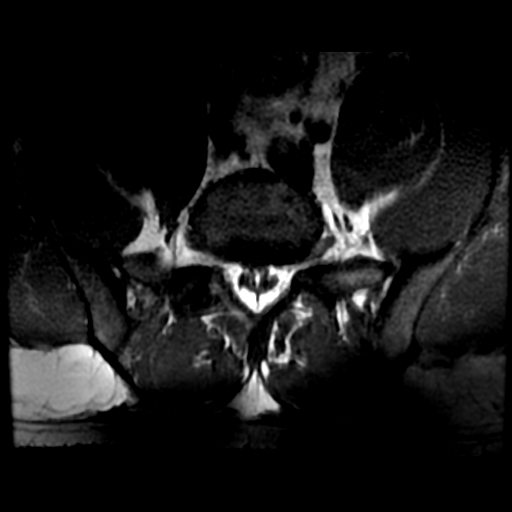
[im 17/34]
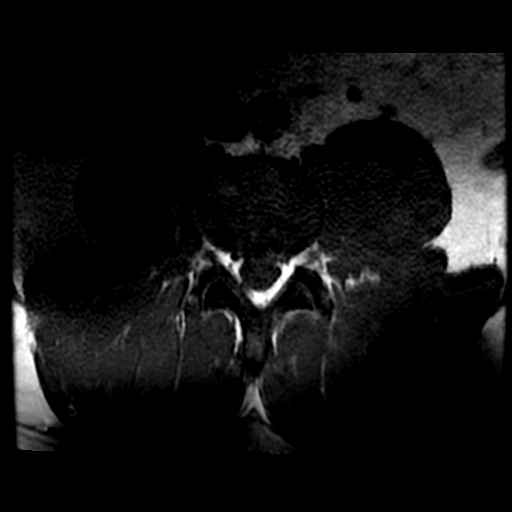
[im 29/34]
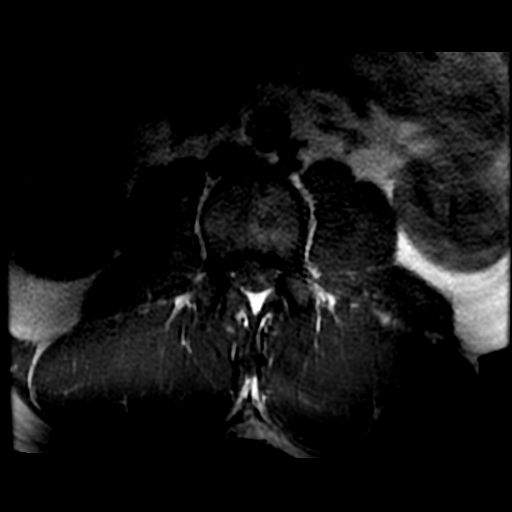

[19 of 48 positions shown; findings below may reference images not displayed]

FINDINGS: Segmentation:  Standard.

Alignment:  Physiologic.

Vertebrae:  No fracture, evidence of discitis, or bone lesion.

Conus medullaris and cauda equina: Conus extends to the L1 level.
Conus and cauda equina appear normal.

Paraspinal and other soft tissues: Negative.

Disc levels:

T12-L1: Unremarkable.

L1-L2: Unremarkable.

L2-L3: Unremarkable.

L3-L4: Unremarkable.

L4-L5: Unremarkable.

L5-S1: Mild intervertebral disc height loss, similar to prior. New
small right paracentral disc protrusion contacts the ventral thecal
sac without canal stenosis. The bilateral subarticular recesses and
foramina are widely patent.
IMPRESSION: 1. New small right paracentral disc protrusion at L5-S1 contacts the
ventral thecal sac without canal stenosis.
2. Otherwise unremarkable MRI without foraminal stenosis or evidence
of neural impingement within the lumbar spine.

## 2020-10-03 ENCOUNTER — Ambulatory Visit (INDEPENDENT_AMBULATORY_CARE_PROVIDER_SITE_OTHER): Payer: 59 | Admitting: Internal Medicine

## 2020-10-03 ENCOUNTER — Other Ambulatory Visit: Payer: Self-pay

## 2020-10-03 ENCOUNTER — Encounter: Payer: Self-pay | Admitting: Internal Medicine

## 2020-10-03 VITALS — BP 155/100 | HR 71 | Temp 98.1°F | Ht 69.5 in | Wt 249.2 lb

## 2020-10-03 DIAGNOSIS — M722 Plantar fascial fibromatosis: Secondary | ICD-10-CM | POA: Diagnosis not present

## 2020-10-03 DIAGNOSIS — I10 Essential (primary) hypertension: Secondary | ICD-10-CM

## 2020-10-03 DIAGNOSIS — N181 Chronic kidney disease, stage 1: Secondary | ICD-10-CM

## 2020-10-03 DIAGNOSIS — F319 Bipolar disorder, unspecified: Secondary | ICD-10-CM

## 2020-10-03 DIAGNOSIS — E669 Obesity, unspecified: Secondary | ICD-10-CM | POA: Diagnosis not present

## 2020-10-03 DIAGNOSIS — K2970 Gastritis, unspecified, without bleeding: Secondary | ICD-10-CM

## 2020-10-03 DIAGNOSIS — E782 Mixed hyperlipidemia: Secondary | ICD-10-CM

## 2020-10-03 DIAGNOSIS — F419 Anxiety disorder, unspecified: Secondary | ICD-10-CM

## 2020-10-03 MED ORDER — AMLODIPINE BESYLATE 5 MG PO TABS: 5.0000 mg | ORAL_TABLET | Freq: Every day | ORAL | 11 refills | Status: DC
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
## 2020-10-03 NOTE — Patient Instructions (Addendum)
Thank you for allowing [place] to provide your care today. Today we discussed your foot pain and abdominal pain  Today we made the following changes to your medications:   Please START taking   Amlodipine (norvasc) 5mg  - take one tablet per day  Omeprazole 40 mg for one month. Then stop medication to reassess symptoms.    Please STOP taking   Ibuprofen or other NSAIDs  Please follow-up in two weeks for blood pressure check.  Please call the internal medicine center clinic if you have any questions or concerns, we may be able to help and keep you from a long and expensive emergency room wait. Our clinic and after hours phone number is [phone], the best time to call is Monday through Friday 9 am to 4 pm but there is always someone available 24/7 if you have an emergency. If you need medication refills please notify your pharmacy one week in advance and they will send Wednesday a request.

## 2020-10-04 DIAGNOSIS — M722 Plantar fascial fibromatosis: Secondary | ICD-10-CM | POA: Insufficient documentation

## 2020-10-04 DIAGNOSIS — K2970 Gastritis, unspecified, without bleeding: Secondary | ICD-10-CM | POA: Insufficient documentation

## 2020-10-04 DIAGNOSIS — E785 Hyperlipidemia, unspecified: Secondary | ICD-10-CM | POA: Insufficient documentation

## 2020-10-04 LAB — LIPID PANEL
Chol/HDL Ratio: 6 ratio — ABNORMAL HIGH (ref 0.0–5.0)
Cholesterol, Total: 215 mg/dL — ABNORMAL HIGH (ref 100–199)
HDL: 36 mg/dL — ABNORMAL LOW (ref >39)
LDL Chol Calc (NIH): 144 mg/dL — ABNORMAL HIGH (ref 0–99)
Triglycerides: 192 mg/dL — ABNORMAL HIGH (ref 0–149)
VLDL Cholesterol Cal: 35 mg/dL (ref 5–40)

## 2020-10-04 LAB — BMP8+ANION GAP
Anion Gap: 16 mmol/L (ref 10.0–18.0)
BUN/Creatinine Ratio: 9 (ref 9–20)
BUN: 13 mg/dL (ref 6–20)
CO2: 25 mmol/L (ref 20–29)
Calcium: 9.8 mg/dL (ref 8.7–10.2)
Chloride: 101 mmol/L (ref 96–106)
Creatinine, Ser: 1.38 mg/dL — ABNORMAL HIGH (ref 0.76–1.27)
GFR calc Af Amer: 78 mL/min/{1.73_m2} (ref >59)
GFR calc non Af Amer: 68 mL/min/{1.73_m2} (ref >59)
Glucose: 79 mg/dL (ref 65–99)
Potassium: 4.2 mmol/L (ref 3.5–5.2)
Sodium: 142 mmol/L (ref 134–144)

## 2020-10-04 LAB — HEMOGLOBIN A1C
Est. average glucose Bld gHb Est-mCnc: 103 mg/dL
Hgb A1c MFr Bld: 5.2 % (ref 4.8–5.6)

## 2020-10-04 NOTE — Assessment & Plan Note (Signed)
BP Readings from Last 3 Encounters:  10/03/20 (!) 155/100  07/13/20 (!) 139/98  02/18/20 120/80   Blood pressure has been elevated multiple prior visits. Discussed risks and benefits of starting therapy.   - start norvasc 5 mg qd  - A1c, BMP, lipid panel  - f/u in two weeks for BP check  - discussed continued weight loss and dietary intervention

## 2020-10-04 NOTE — Assessment & Plan Note (Signed)
He has been having pain in his left medial forefoot/heell for the last few months that becomes worse at the end of the day. Prior to this he had stiffness in his feet in the mornings. He works long hours on his feet. He did injure his foot when he was younger jumping from a large height. His shoes are somewhat worn down on the inside. He has tried inserts previously which did not help much. Symptoms consistent with plantar fasciitis although possible component of arthritic changes with prior injury.   - given exercises and stretching, alternate ice/heat, roll with ice water bottle - referral to podiatry

## 2020-10-04 NOTE — Assessment & Plan Note (Signed)
He has been having RUQ epigastric pain for the past several months, mostly when he is hungry, this is relieved when he eats. He denies nausea, vomiting, dark stools, other changes in bowel movements, difficulty with urination or dysuria, weight loss, night sweats, or family history of colon cancer. He has reflux that has recently been worse as well. He takes Ibuprofen 800 mg most days, occasionally he will take a second dose. No alarm symptoms.   - discussed ibuprofen cessation, can use tylenol, voltaren gel, alternate ice and heat of of pain  - omeprazole 40 mg for four weeks, then reassess symptoms

## 2020-10-04 NOTE — Progress Notes (Signed)
Internal Medicine Clinic Attending  Case discussed with Dr. Seawell  At the time of the visit.  We reviewed the resident's history and exam and pertinent patient test results.  I agree with the assessment, diagnosis, and plan of care documented in the resident's note.  

## 2020-10-04 NOTE — Assessment & Plan Note (Signed)
Cholesterol, LDL, and triglycerides elevated. HDL decreased. Discussed risks and benefits of starting a statin. He would like to try dietary and weight loss management at this time. Discussed exercise and foods to avoid and increase.   - f/u in 6 months to 1 year for repeat lipid panel

## 2020-10-04 NOTE — Assessment & Plan Note (Signed)
He has chronic depression and is seen by psychiatry. He was recently taken off his duloxetine due to abdominal pain and carbamazepine was increased to 300 qhs. He feels that his depression is well controlled, although PHQ-9 today is 12, and feels that anxiety is his largest concern. He has chronic diffuse pain, especially in his foot, and feels that this is making his anxiety worse. He feels that he gets aggravated very easily. No SI.   - discussed following up with psychiatry as he may benefit from restarting SNRI or SSRI  - cont. Carbamazepine 300 mg qhs  - previously seen by Phineas Semen, he would like another referral to [facility] although is not sure he will be able to attend due to his work hours

## 2020-10-05 ENCOUNTER — Encounter: Payer: Self-pay | Admitting: Internal Medicine

## 2020-10-11 ENCOUNTER — Encounter: Payer: Self-pay | Admitting: Internal Medicine

## 2020-10-11 ENCOUNTER — Telehealth: Payer: Self-pay | Admitting: *Deleted

## 2020-10-11 NOTE — Telephone Encounter (Signed)
Leila Fries, pt's spouse would like dr Criselda Peaches to call her 12/29 to discuss issues of pt's care.

## 2020-10-11 NOTE — Telephone Encounter (Signed)
Message was sent to me in error.  Dr. Criselda Peaches would like Doris or triage give patient a call.

## 2020-10-12 NOTE — Telephone Encounter (Signed)
Called and discussed care with Ms. Paul Jarvis at patient and [name]' request.   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   Will get him into an attending appointment as soon as possible in the new year.   She expressed understanding.   , MD

## 2020-10-12 NOTE — Telephone Encounter (Signed)
Attempted to call patient at [phone].  Left voicemail.  Please have her transferred to me if she calls back.   Debe Coder, MD

## 2020-10-17 ENCOUNTER — Ambulatory Visit: Payer: BC Managed Care – PPO | Admitting: Podiatry

## 2020-10-19 ENCOUNTER — Other Ambulatory Visit: Payer: 59

## 2020-10-20 ENCOUNTER — Telehealth: Payer: Self-pay

## 2020-10-20 NOTE — Telephone Encounter (Signed)
Pt wife is needing a call back in regards to  Change of provider

## 2020-10-26 ENCOUNTER — Ambulatory Visit: Payer: 59 | Admitting: Behavioral Health

## 2020-10-26 ENCOUNTER — Other Ambulatory Visit: Payer: Self-pay

## 2020-10-26 DIAGNOSIS — F319 Bipolar disorder, unspecified: Secondary | ICD-10-CM

## 2020-10-26 DIAGNOSIS — F419 Anxiety disorder, unspecified: Secondary | ICD-10-CM
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
## 2020-11-02 ENCOUNTER — Ambulatory Visit: Payer: 59 | Admitting: Podiatry

## 2020-11-10 ENCOUNTER — Ambulatory Visit: Payer: 59 | Admitting: Behavioral Health

## 2020-11-17 ENCOUNTER — Other Ambulatory Visit: Payer: Self-pay

## 2020-11-17 ENCOUNTER — Ambulatory Visit: Payer: 59 | Admitting: Behavioral Health

## 2020-11-17 DIAGNOSIS — F419 Anxiety disorder, unspecified: Secondary | ICD-10-CM

## 2020-11-17 DIAGNOSIS — Z62810 Personal history of physical and sexual abuse in childhood: Secondary | ICD-10-CM

## 2020-11-17 DIAGNOSIS — F319 Bipolar disorder, unspecified: Secondary | ICD-10-CM

## 2020-11-17 NOTE — BH Specialist Note (Signed)
Integrated Behavioral Health via Telemedicine Visit  11/17/2020 Connie Hilgert [id number]  Number of Integrated Behavioral Health visits: 2/6 Session Start time: 3:00pm  Session End time: 3:40pm Total time: 40   Referring Provider: [facility] Patient/Family location: Pt is in private [facility] Provider location: [facility] Office All persons participating in visit: Pt & Clinician Types of Service: Individual psychotherapy  I connected with [name] and/or [name] self by Telephone  (Video is Caregility application) and verified that I am speaking with the correct person using two identifiers.Discussed confidentiality: Yes   I discussed the limitations of telemedicine and the availability of in person appointments.  Discussed there is a possibility of technology failure and discussed alternative modes of communication if that failure occurs.  I discussed that engaging in this telemedicine visit, they consent to the provision of behavioral healthcare and the services will be billed under their insurance.  Patient and/or legal guardian expressed understanding and consented to Telemedicine visit: Yes   Presenting Concerns: Patient and/or family reports the following symptoms/concerns: chronic pain issues Duration of problem: since childhood at age 30yo; Severity of problem: moderate  Patient and/or Family's Strengths/Protective Factors: Social connections, Social and Emotional competence, Concrete supports in place (healthy food, safe environments, etc.) and Sense of purpose  Goals Addressed: Patient will: 1.  Reduce symptoms of: anxiety, depression, stress and trauma rxn Sx  2.  Increase knowledge and/or ability of: coping skills, stress reduction and psychoedu about TIC  3.  Demonstrate ability to: Increase healthy adjustment to current life circumstances and assist Pt to deal w/Hx of CSA  Progress towards Goals: Ongoing  Interventions: Interventions utilized:  Supportive Counseling  and TIC Standardized Assessments completed: Not Needed  Patient and/or Family Response: Pt receptive to call today & requests a next session stating psychotherapy is helping him.  Assessment: Patient currently experiencing episodes of elevated anxiety when he is triggered.   Patient may benefit from strategies to manage anxiety psychologically.  Plan: 1. Follow up with behavioral health clinician on : 2 wks on telehealth for an hour 2. Behavioral recommendations: Write down things that occur to you for our next session. Cont to care for self & promote your spiritual beliefs. 3. Referral(s): Integrated [facility] (In Clinic)  I discussed the assessment and treatment plan with the patient and/or parent/guardian. They were provided an opportunity to ask questions and all were answered. They agreed with the plan and demonstrated an understanding of the instructions.   They were advised to call back or seek an in-person evaluation if the symptoms worsen or if the condition fails to improve as anticipated.  Deneise Lever, LMFT

## 2020-11-21 ENCOUNTER — Encounter: Payer: Self-pay | Admitting: Student in an Organized Health Care Education/Training Program

## 2020-11-21 ENCOUNTER — Ambulatory Visit: Payer: 59 | Admitting: Student in an Organized Health Care Education/Training Program

## 2020-11-21 ENCOUNTER — Other Ambulatory Visit: Payer: Self-pay

## 2020-11-21 VITALS — BP 166/105 | HR 82 | Temp 98.4°F | Ht 69.0 in | Wt 239.8 lb

## 2020-11-21 DIAGNOSIS — G4733 Obstructive sleep apnea (adult) (pediatric): Secondary | ICD-10-CM | POA: Insufficient documentation

## 2020-11-21 DIAGNOSIS — I10 Essential (primary) hypertension: Secondary | ICD-10-CM | POA: Diagnosis not present

## 2020-11-21 DIAGNOSIS — M797 Fibromyalgia: Secondary | ICD-10-CM

## 2020-11-21 MED ORDER — AMITRIPTYLINE HCL 10 MG PO TABS
10.0000 mg | ORAL_TABLET | Freq: Every day | ORAL | 3 refills | Status: DC
Start: 2020-11-21 — End: 2021-12-25

## 2020-11-21 MED ORDER — AMLODIPINE BESYLATE-VALSARTAN 5-160 MG PO TABS: 1.0000 | ORAL_TABLET | Freq: Every day | ORAL | 3 refills | Status: DC

## 2020-11-21 NOTE — Patient Instructions (Addendum)
Thank you for visiting [place] today, it was a pleasure to meet you. We talked about my suspicion that you have Fibromyalgia Syndrome. I gave you some information about that condition and we will start a low dose of a night time medicine to use called Amitriptyline.

## 2020-11-21 NOTE — Assessment & Plan Note (Signed)
Blood pressure elevated today in the clinic.  He is a strong family history of hypertension, uncle who passed away from heart disease in his early 53s.  Plan is to continue amlodipine 5 mg daily and add valsartan 160 mg daily.  Follow-up in 6 weeks for blood pressure recheck and BMP.  He has a home blood pressure cuff and I asked him to check his home blood pressure at least twice a week.

## 2020-11-21 NOTE — Assessment & Plan Note (Signed)
Unexplained joint, limb, and trunk pain since at least 2012 which has been function limiting and associated with depressed mood.  On my exam his joints are normal with no signs of synovitis or effusions.  I reviewed the prior x-rays of his left hip, left knee, SI joints, and MRI of his lumbar spine which were also largely normal, there is no structural explanation for his symptoms.  He has had steroid injections into his right shoulder and left hip which helped him for a very short amount of time until return of symptoms.  He has had normal laboratory work-up with no evidence of rheumatologic disease.  Widespread pain index score is very elevated at 11, symptom severity scale score also very elevated at 12.  Overall I think fibromyalgia syndrome is the most likely explanation.  I gave the patient counseling and some printed educational materials on the syndrome.  We talked about managing his sleep quality, and acting exercise program, importance of weight loss, follow-up with his behavioral health providers, and the role of medications.  In the past he has tried Flexeril and duloxetine which were only of limited benefit.  We decided to start amitriptyline 10 mg once nightly and we talked about reasonable expectations.  I will follow the patient up in 6 weeks.

## 2020-11-21 NOTE — Progress Notes (Signed)
Assessment and Plan:  See Encounters tab for problem-based medical decision making.   __________________________________________________________  HPI:   30 year old person living with obesity and hypertension presenting to the clinic for an evaluation of diffuse joint pain.  Patient has been seen by the resident physicians in the [facility] since at least 2014.  He has tried many different medications and has had referrals to sports medicine and orthopedics but unable to find a unifying diagnosis to explain his syndrome of diffuse pain and fatigue.  I was asked to do a thorough review of testing done so far and provide another opinion.  Paul Jarvis tells me that these problems started with right shoulder and right neck pain in 2012 and pain in that area persists to today.  The pain radiates down his shoulder, skips his upper arm, and that he feels pain in his right lower arm near the wrist and into the hand.  He feels some paresthesias and diminished strength in that arm.  In addition he feels pain on his right upper back over the scapula.  Over the last year he has had pain of his left upper abdomen near the epigastrium.  He feels pain in his left lateral hip, which often has a burning type paresthesia that goes down to the mid thigh.  Additionally he has a left groin pain which radiates into his left scrotum and down the medial left thigh.  He has left knee pain and additional pain in the left ankle and the bottom of the left foot.  He saw an orthopedist about his right shoulder and received a series of steroid injections.  He says the first 1 was very helpful but the final 2 were no help at all.  He was evaluated by sports medicine for the left lateral hip pain and was given home exercises for IT band syndrome, however the pain persisted.  He was evaluated by orthopedics more recently for the left leg pain and had a steroid injection into the left hip which she said helped for a few weeks but now the pain  is returned.  He is tried different medications over the past including NSAIDs with limited benefits.  He says that prednisone has helped for short periods of time.  He was on duloxetine for period, but then discontinued when he developed the left upper abdominal pain.  He tried Flexeril for.  But said it only made him drowsy.  As an associated symptom he has sleep disturbance.  He complains of several years of nonrestorative sleep and profound fatigue throughout the day.  He is a busy man, works as a Oceanographer in [employer], he is married and has 3 young children aged 49, 2, and 18 months.  The 2 youngest children sleep with him and his wife in his bed which is uncomfortable.  He did appropriately have a in-home sleep study in early 2020 which showed mild obstructive sleep apnea and recommended CPAP titration, however as the pandemic started this was lost to follow-up.  He also has associated longstanding anxiety and depression.  It is listed as bipolar disease in our medical record.  This is managed with carbamazepine 200 mg daily by Dr. Darleene Cleaver at [facility].  His medications have not changed recently and he reports good adherence.  To summarize his work-up thus far, normal lab results have included a CBC, ESR, CRP, CK, TSH, ANA, CCP, HLA-B27, and A1c.  Normal imaging has included x-rays of the SI joints, left hip,  left knee, right wrist, right shoulder, and cervical spine.  He had an MRI of the lumbar spine in November 2020 which was mostly normal, it did show an incidental small right disc protrusion at L5-S1 but without canal stenosis and likely not able to explain his symptoms down the left leg.  Abnormal findings on work-up this far has been his weight which is 249 pounds with a BMI of 35, he has hyperlipidemia, he has hypertension, he has an abnormal sleep study in 2020.  __________________________________________________________  Problem List: Patient Active  Problem List   Diagnosis Date Noted  . Fibromyalgia syndrome 11/21/2020    Priority: High  . Hypertension 07/15/2020    Priority: High  . Obstructive sleep apnea 11/21/2020    Priority: Medium  . Bipolar depression ([place]) 11/17/2015    Priority: Medium  . Healthcare maintenance 11/17/2015    Priority: Low  . Plantar fasciitis of left foot 10/04/2020  . Gastritis 10/04/2020  . Hyperlipidemia 10/04/2020  . Left knee injury, initial encounter 07/15/2020  . Abdominal pain 11/06/2019  . Left leg pain 07/14/2018  . Back pain 11/14/2017  . Muscle tightness 11/14/2017  . Lateral epicondylitis (tennis elbow) 06/21/2016  . Trapezius muscle spasm 06/21/2016  . Right shoulder pain 11/17/2015    Medications: Reconciled today in Epic __________________________________________________________  Physical Exam:  Vital Signs: Vitals:   11/21/20 0912 11/21/20 1046  BP: (!) 160/92 (!) 166/105  Pulse: 95 82  Temp: 98.4 F (36.9 C)   TempSrc: Oral   SpO2: 98%   Weight: 239 lb 12.8 oz (108.8 kg)   Height: _0  (1.753 m)     Gen: Well appearing, NAD ENT: OP clear without erythema or exudate.  Neck: No cervical LAD, No thyromegaly or nodules. CV: RRR, no murmurs Pulm: Normal effort, CTA throughout, no wheezing Abd: Soft, NT, ND, healed appendectomy scar on the right. He does have mild pain to palpation over the bladder.  Gen: Normal uncircumcised penis, normal scrotum, normal testicles on left and right without masses, no hernias elicited with Valsalva. Ext: Warm, no edema, right shoulder has normal range of motion, no impingement bilaterally, he has normal strength throughout but he does have pain with internal rotation and empty can test.  His bilateral hips have normal range of motion, no stiffness, no pain with internal rotation of the femoral heads via windshield wiper test and logroll test.  Bilateral knees are normal with no effusions, minimal crepitus anteriorly. Skin: No atypical  appearing moles. No rashes Psych: Pleasant, normal affect, not anxious or depressed appearing Neuro: Alert, conversational, normal strength in the upper and lower extremities, normal gait   I spent 40 minutes with the patient with over 50% of the encounter time dedicated to counseling on the above problems.

## 2020-11-21 NOTE — Assessment & Plan Note (Signed)
Patient endorses poor sleep quality.  He had an abnormal home sleep study test in 2020 which showed mild obstructive sleep apnea.  We have not followed up with the recommended CPAP titration yet.  I have ordered another sleep study, may need to repeat the diagnostic portion as it has been 2 years.  Hopefully this can be done at home and with a split-night study.

## 2020-12-01 ENCOUNTER — Encounter: Payer: Self-pay | Admitting: Podiatry

## 2020-12-01 ENCOUNTER — Ambulatory Visit: Payer: 59 | Admitting: Podiatry

## 2020-12-01 ENCOUNTER — Other Ambulatory Visit: Payer: Self-pay

## 2020-12-01 DIAGNOSIS — M722 Plantar fascial fibromatosis: Secondary | ICD-10-CM

## 2020-12-01 MED ORDER — TRIAMCINOLONE ACETONIDE 40 MG/ML IJ SUSP
40.0000 mg | Freq: Once | INTRAMUSCULAR | Status: AC
  Administered 2020-12-01: 40 mg

## 2020-12-01 MED ORDER — METHYLPREDNISOLONE 4 MG PO TBPK: ORAL_TABLET | ORAL | 0 refills | Status: DC

## 2020-12-01 MED ORDER — MELOXICAM 15 MG PO TABS: 15.0000 mg | ORAL_TABLET | Freq: Every day | ORAL | 3 refills | Status: DC
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
## 2020-12-06 ENCOUNTER — Ambulatory Visit: Payer: 59 | Admitting: Behavioral Health

## 2020-12-09 ENCOUNTER — Ambulatory Visit: Payer: 59 | Admitting: Behavioral Health

## 2020-12-09 ENCOUNTER — Other Ambulatory Visit: Payer: Self-pay

## 2020-12-09 DIAGNOSIS — F419 Anxiety disorder, unspecified: Secondary | ICD-10-CM

## 2020-12-09 NOTE — BH Specialist Note (Signed)
Integrated Behavioral Health via Telemedicine Visit  12/09/2020 Paige Vanderwoude [id number]  Number of Integrated Behavioral Health visits: 3/6 Session Start time: 3:00pm  Session End time: 3:45pm Total time: 59   Referring Provider: Dr. Oswaldo Done, MD Patient/Family location: Pt at work in private [facility] Provider location: [facility] Office All persons participating in visit: Pt & Clinician Types of Service: Individual psychotherapy  I connected with [name] and/or [name] self by Telephone  (Video is Caregility application) and verified that I am speaking with the correct person using two identifiers.Discussed confidentiality: Yes   I discussed the limitations of telemedicine and the availability of in person appointments.  Discussed there is a possibility of technology failure and discussed alternative modes of communication if that failure occurs.  I discussed that engaging in this telemedicine visit, they consent to the provision of behavioral healthcare and the services will be billed under their insurance.  Patient and/or legal guardian expressed understanding and consented to Telemedicine visit: Yes   Presenting Concerns: Patient and/or family reports the following symptoms/concerns: elevated anxiety due to having 2 days of low mood, low motivation, lack of desire for eating healthy, lack of optimal dec-mkg for himself, & wanting to curl up & be alone. Pt sts, "My Wife wants me off the Elavil." They have discussed how this might be contributing to his 2 bad days. It was difficult to push through these 2 days. Pt has been Dx'd w/fibromyalgia (which is under-recognized in men). Pt also rec'd cortisone injections in his feet due to plantar fascitis. Discussed how there may be possible Rx interactions contributing to his low days. Duration of problem: a week of inc'd severity in anxiety Sx; Severity of problem: moderate to severe  Patient and/or Family's Strengths/Protective Factors: Social  connections, Social and Emotional competence and Concrete supports in place (healthy food, safe environments, etc.)  Goals Addressed: Patient will: 1.  Reduce symptoms of: anxiety, depression, insomnia and stress  2.  Increase knowledge and/or ability of: coping skills and stress reduction  3.  Demonstrate ability to: Increase healthy adjustment to current life circumstances  Progress towards Goals: Ongoing  Interventions: Interventions utilized:  Supportive Counseling and Sleep Hygiene Standardized Assessments completed: Not Needed  Patient and/or Family Response: Pt receptive to call today. Promoted the use of visual imagery for reduction in anxiety. Discussed the use of ice cubes in the hands to activate the Pre-Frontal Cortex to distract & disengage anxiety response. Discussed the eventual need for Referral to Provider in the Community who can use EMDR for trauma. Promoted the use of Tapping (which Pt can learn online) for trauma care.  Clincian will contact Dr. Cheral Almas for review of medications for possible interactions.  Assessment: Patient currently experiencing heightened anxiety.   Patient may benefit from all coping skills listed above.  Plan: 1. Follow up with behavioral health clinician on : one week out for 30 min 2. Behavioral recommendations: Try visual imagery w/review of the senses, ice, & tapping to reduce anxiety response. 3. Referral(s): Integrated [facility] (In Clinic)  I discussed the assessment and treatment plan with the patient and/or parent/guardian. They were provided an opportunity to ask questions and all were answered. They agreed with the plan and demonstrated an understanding of the instructions.   They were advised to call back or seek an in-person evaluation if the symptoms worsen or if the condition fails to improve as anticipated.  Deneise Lever, LMFT

## 2020-12-13 ENCOUNTER — Telehealth: Payer: Self-pay | Admitting: Behavioral Health

## 2020-12-13 NOTE — Telephone Encounter (Signed)
Contacted Pt regarding anxiety issues & Pharmacist's Rx interaction information.   Dr. Cheral Almas provided no serious interactional concerns w/Pt's drug regime. She did mention tiredness which Pt endorses, "I definitely noticed it."  Pt is out of work today w/food poisoning. His children have also been sick from bad lunch meat.  Pt is using tools suggested on Fri for his anxiety & it is, "really helping." He is also trying to correct his thinking.  Dr. Monna Fam

## 2020-12-29 ENCOUNTER — Ambulatory Visit: Payer: 59 | Admitting: Podiatry

## 2020-12-29 ENCOUNTER — Other Ambulatory Visit: Payer: Self-pay | Admitting: *Deleted

## 2021-01-02 ENCOUNTER — Encounter: Payer: 59 | Admitting: Student in an Organized Health Care Education/Training Program

## 2021-01-03 ENCOUNTER — Ambulatory Visit: Payer: 59 | Admitting: Behavioral Health

## 2021-01-03 ENCOUNTER — Other Ambulatory Visit: Payer: Self-pay

## 2021-01-03 DIAGNOSIS — F419 Anxiety disorder, unspecified: Secondary | ICD-10-CM

## 2021-01-03 NOTE — BH Specialist Note (Signed)
Integrated Behavioral Health via Telemedicine Visit  01/03/2021 Paul Jarvis [id number]  Number of Integrated Behavioral Health visits: 4/6 Session Start time: 3:10pm  Session End time: 3:50pm Total time: 40   Referring Provider: Dr. Oswaldo Done, MD Patient/Family location: Pt at work alone in the field [facility] Provider location: [facility] Office All persons participating in visit: Pt & Clinician Types of Service: Individual psychotherapy  I connected with Paul Jarvis and/or Paul Jarvis self via  Telephone or Video Enabled Telemedicine Application  (Video is Caregility application) and verified that I am speaking with the correct person using two identifiers. Discussed confidentiality: Yes   I discussed the limitations of telemedicine and the availability of in person appointments.  Discussed there is a possibility of technology failure and discussed alternative modes of communication if that failure occurs.  I discussed that engaging in this telemedicine visit, they consent to the provision of behavioral healthcare and the services will be billed under their insurance.  Patient and/or legal guardian expressed understanding and consented to Telemedicine visit: Yes   Presenting Concerns: Patient and/or family reports the following symptoms/concerns: dec'd levels of  Duration of problem: years; Severity of problem: moderate  Patient and/or Family's Strengths/Protective Factors: Social connections, Social and Emotional competence, Concrete supports in place (healthy food, safe environments, etc.) and Sense of purpose  Goals Addressed: Patient will: 1.  Reduce symptoms of: anxiety and depression  2.  Increase knowledge and/or ability of: coping skills and stress reduction  3.  Demonstrate ability to: Increase healthy adjustment to current life circumstances  Progress towards Goals: Ongoing  Interventions: Interventions utilized:  Solution-Focused Strategies, Supportive Counseling and  Psychoeducation and/or Health Education Standardized Assessments completed: Not Needed  Patient and/or Family Response: Pt receptive to call today & wanting future appts  Assessment: Patient currently experiencing inc'd stress/anx @ work. Home life is going well. Wife & Pt want to save for a bigger home.  Patient may benefit from cont'd encouragement in Cslg.  Plan: 1. Follow up with behavioral health clinician on : f71f for one hour in 2 wks 2. Behavioral recommendations: Cont your positive attitude about work & learning 3. Referral(s): Integrated [employer] (In Clinic)  I discussed the assessment and treatment plan with the patient and/or parent/guardian. They were provided an opportunity to ask questions and all were answered. They agreed with the plan and demonstrated an understanding of the instructions.   They were advised to call back or seek an in-person evaluation if the symptoms worsen or if the condition fails to improve as anticipated.  Paul Lever, LMFT

## 2021-01-16 ENCOUNTER — Encounter: Payer: 59 | Admitting: Student in an Organized Health Care Education/Training Program

## 2021-01-17 ENCOUNTER — Ambulatory Visit: Payer: 59 | Admitting: Podiatry

## 2021-02-20 ENCOUNTER — Encounter: Payer: Self-pay | Admitting: Student in an Organized Health Care Education/Training Program

## 2021-02-20 ENCOUNTER — Ambulatory Visit: Payer: 59 | Admitting: Student in an Organized Health Care Education/Training Program

## 2021-02-20 VITALS — BP 149/96 | HR 84 | Temp 98.1°F | Wt 237.6 lb

## 2021-02-20 DIAGNOSIS — G4733 Obstructive sleep apnea (adult) (pediatric): Secondary | ICD-10-CM | POA: Diagnosis not present

## 2021-02-20 DIAGNOSIS — M797 Fibromyalgia: Secondary | ICD-10-CM | POA: Diagnosis not present

## 2021-02-20 DIAGNOSIS — I10 Essential (primary) hypertension: Secondary | ICD-10-CM

## 2021-02-20 MED ORDER — AMLODIPINE BESYLATE-VALSARTAN 5-160 MG PO TABS
2.0000 | ORAL_TABLET | Freq: Every day | ORAL | 3 refills | Status: DC
Start: 2021-02-20 — End: 2021-06-18

## 2021-02-20 MED ORDER — METHOCARBAMOL 500 MG PO TABS: 500.0000 mg | ORAL_TABLET | Freq: Every day | ORAL | 1 refills | Status: DC | PRN

## 2021-02-20 NOTE — Progress Notes (Signed)
Attestation for Student Documentation:  I personally was present and performed or re-performed the history, physical exam and medical decision-making activities of this service and have verified that the service and findings are accurately documented in the student's note.  30 year old person here for follow-up of fibromyalgia.  This was a new diagnosis at her last visit about 3 months ago, he has found some benefit with the nightly amitriptyline.  Elected up to him whether he wants to continue with that medication.  He still is having a pretty significant burden of symptoms.  We talked for about 20 minutes about components of sleep, stress, exercise, and nutrition.  He is working with our Arts development officer for counseling.  His wife was accompanying him today and is very supportive.  His exam is again benign, joints appear normal, no evidence of synovitis or effusions.  We briefly discussed adding Lyrica.  We are unsuccessful in ordering a home sleep study, so I am ordering a traditional split night sleep study as my suspicion for some amount of sleep apnea is high.  Blood pressure also does not seem totally well controlled on the combination amlodipine valsartan.  We will increase this to 2 tablets daily which is full dose.  We will check BMP today.  Tyson Alias, MD 02/20/2021, 1:34 PM

## 2021-02-20 NOTE — Assessment & Plan Note (Signed)
Currently taking amlodipine 5mg -valsartan 160mg  and reports that blood pressures at home have been elevated.   A&P: Poorly controlled hypertension complicated by untreated OSA. BP today 149/96 - BMP today - if normal, increase to amlodipine 10 mg - valsartan 320 mg - Follow up regarding OSA workup - Continue monitoring blood pressure at home - Will recheck at next visit

## 2021-02-20 NOTE — Assessment & Plan Note (Signed)
Patient endorses poor sleep quality and associated fatigue. He had an abnormal sleep study in 2020 and another test was ordered at the last visit but he has not heard anything back regarding scheduling.   A&P: Uncontrolled OSA without CPAP use - will follow up regarding sleep study testing with the goal of CPAP use in the future

## 2021-02-20 NOTE — Assessment & Plan Note (Addendum)
Patient is still struggling with widespread pain and has many questions regarding the fibromyalgia diagnosis. He is unsure what pain can be attributed to the fibromyalgia versus actual pathology. He has seen various orthopedic specialists in the past who have given him diagnoses for pain in various parts of his body and offered surgery but was given the unifying diagnosis of fibromyalgia at the last visit at [facility]. He has been taking amitriptyline 10mg  and reports that it was working well in the beginning but that all of a sudden one month ago it stopped working completely. Soon after then he experienced some social stressors that resulted in the worsening of his pain and depression. He reports engaging in intense exercise with a workout group in the past but is currently not doing any activity outside of his job as a due to pain in his left leg. He endorses continued poor sleep and is currently waiting to hear back regarding scheduling for a sleep study for suspected OSA. He has noticed some benefit from counseling in the past but reports that he has not had an appointment in a month.   A&P: Recently diagnosed fibromyalgia with currently worsening symptoms in the setting of social stressors and reduced lifestyle support. Discussed diagnosis at length and engaged in conversation regarding physiology, expectations, and treatment approaches. Specifically mentioned that medication is only a small part of the treatment and that in order to obtain maximum benefit, it is important to address sleep, stress, exercise, and counseling.  - OK to stop amitriptyline, unlikely to experience severe withdrawal symptoms given current low dose. Will consider restarting after making lifestyle changes - Recommended exercise - Follow up with Dr. Soil scientist for counseling - Discussed stress reduction  - Follow up in 2 months
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
## 2021-02-21 LAB — BMP8+ANION GAP
Anion Gap: 17 mmol/L (ref 10.0–18.0)
BUN/Creatinine Ratio: 13 (ref 9–20)
BUN: 15 mg/dL (ref 6–20)
CO2: 20 mmol/L (ref 20–29)
Calcium: 9.5 mg/dL (ref 8.7–10.2)
Chloride: 104 mmol/L (ref 96–106)
Creatinine, Ser: 1.17 mg/dL (ref 0.76–1.27)
Glucose: 88 mg/dL (ref 65–99)
Potassium: 4.4 mmol/L (ref 3.5–5.2)
Sodium: 141 mmol/L (ref 134–144)
eGFR: 85 mL/min/{1.73_m2} (ref >59)

## 2021-03-03 ENCOUNTER — Telehealth: Payer: Self-pay

## 2021-03-03 NOTE — Telephone Encounter (Signed)
#  180 with 3 refills sent 02/20/21 to requested pharmacy. Patient notified. He will call pharmacy for refill.

## 2021-03-03 NOTE — Telephone Encounter (Signed)
Need refill on amLODipine-valsartan (EXFORGE) 5-160 MG tablet  ;pt contact [phone]   [facility] [phone] - [place], [place] - [address]

## 2021-03-07 ENCOUNTER — Telehealth: Payer: Self-pay | Admitting: Behavioral Health

## 2021-03-07 NOTE — Telephone Encounter (Signed)
Contacted Pt this morning to ensure his scheduled appt on 03/23/2021 @ 1:00pm is sufficient & issues are not urgent. Pt @ work & sts the day & time are sufficient.  Dr. Monna Fam

## 2021-03-23 ENCOUNTER — Ambulatory Visit: Payer: 59 | Admitting: Behavioral Health

## 2021-04-18 ENCOUNTER — Encounter: Payer: Self-pay | Admitting: *Deleted

## 2021-06-18 ENCOUNTER — Encounter (HOSPITAL_COMMUNITY): Payer: Self-pay | Admitting: Emergency Medicine

## 2021-06-18 ENCOUNTER — Emergency Department (HOSPITAL_COMMUNITY)
Admission: EM | Admit: 2021-06-18 | Discharge: 2021-06-18 | Disposition: A | Discharge status: Home / Self Care | Payer: 59 | Attending: Emergency Medicine | Admitting: Emergency Medicine

## 2021-06-18 ENCOUNTER — Other Ambulatory Visit: Payer: Self-pay

## 2021-06-18 ENCOUNTER — Emergency Department (HOSPITAL_COMMUNITY): Payer: 59

## 2021-06-18 DIAGNOSIS — I10 Essential (primary) hypertension: Secondary | ICD-10-CM

## 2021-06-18 DIAGNOSIS — R079 Chest pain, unspecified: Secondary | ICD-10-CM | POA: Diagnosis present

## 2021-06-18 DIAGNOSIS — Z79899 Other long term (current) drug therapy: Secondary | ICD-10-CM | POA: Insufficient documentation

## 2021-06-18 HISTORY — DX: Essential (primary) hypertension: I10

## 2021-06-18 LAB — BASIC METABOLIC PANEL
Anion gap: 7 (ref 5–15)
BUN: 14 mg/dL (ref 6–20)
CO2: 27 mmol/L (ref 22–32)
Calcium: 9.4 mg/dL (ref 8.9–10.3)
Chloride: 103 mmol/L (ref 98–111)
Creatinine, Ser: 1.22 mg/dL (ref 0.61–1.24)
GFR, Estimated: >60 mL/min (ref >60)
Glucose, Bld: 93 mg/dL (ref 70–99)
Potassium: 3.9 mmol/L (ref 3.5–5.1)
Sodium: 137 mmol/L (ref 135–145)

## 2021-06-18 LAB — CBC
HCT: 44.9 % (ref 39.0–52.0)
Hemoglobin: 14.8 g/dL (ref 13.0–17.0)
MCH: 28 pg (ref 26.0–34.0)
MCHC: 33 g/dL (ref 30.0–36.0)
MCV: 84.9 fL (ref 80.0–100.0)
Platelets: 263 10*3/uL (ref 150–400)
RBC: 5.29 MIL/uL (ref 4.22–5.81)
RDW: 14 % (ref 11.5–15.5)
WBC: 4.3 10*3/uL (ref 4.0–10.5)
nRBC: 0 % (ref 0.0–0.2)

## 2021-06-18 LAB — TROPONIN I (HIGH SENSITIVITY): Troponin I (High Sensitivity): 6 ng/L (ref <18)

## 2021-06-18 MED ORDER — AMLODIPINE BESYLATE 5 MG PO TABS
10.0000 mg | ORAL_TABLET | Freq: Once | ORAL | Status: AC
  Administered 2021-06-18: 10 mg via ORAL
  Filled 2021-06-18: qty 2

## 2021-06-18 MED ORDER — LOSARTAN POTASSIUM 50 MG PO TABS: 50.0000 mg | ORAL_TABLET | Freq: Every day | ORAL | 0 refills | Status: DC

## 2021-06-18 NOTE — Discharge Instructions (Addendum)
Call your primary care doctor or specialist as discussed in the next 2-3 days.   Return immediately back to the ER if:  Your symptoms worsen within the next 12-24 hours. You develop new symptoms such as new fevers, persistent vomiting, new pain, shortness of breath, or new weakness or numbness, or if you have any other concerns.  

## 2021-06-18 NOTE — ED Triage Notes (Signed)
Pt here for HTN after checking BP at home, reading 176/127. Pt has hx HTN, was on meds but stopped taking them in June for interaction w/ another med. Pt denies dizziness, HA. C/o some chest tightness and weakness in L foot
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
## 2021-07-19 ENCOUNTER — Encounter: Payer: Self-pay | Admitting: Student

## 2021-07-19 ENCOUNTER — Ambulatory Visit: Payer: 59 | Admitting: Student

## 2021-07-19 VITALS — BP 143/94 | HR 79 | Temp 98.2°F | Resp 20 | Ht 69.5 in | Wt 238.8 lb

## 2021-07-19 DIAGNOSIS — I10 Essential (primary) hypertension: Secondary | ICD-10-CM | POA: Diagnosis not present

## 2021-07-19 DIAGNOSIS — M722 Plantar fascial fibromatosis: Secondary | ICD-10-CM

## 2021-07-19 MED ORDER — AMLODIPINE BESYLATE-VALSARTAN 10-320 MG PO TABS: 1.0000 | ORAL_TABLET | Freq: Every day | ORAL | 2 refills | Status: DC

## 2021-07-19 MED ORDER — MELOXICAM 15 MG PO TABS: 15.0000 mg | ORAL_TABLET | Freq: Every day | ORAL | 3 refills | Status: DC

## 2021-07-19 MED ORDER — METHOCARBAMOL 500 MG PO TABS: 500.0000 mg | ORAL_TABLET | Freq: Every day | ORAL | 0 refills | Status: DC | PRN

## 2021-07-19 NOTE — Assessment & Plan Note (Signed)
Patient with history of plantar fasciitis and previously followed by podiatry but did not follow-up due to "specialist not a my network".  His symptoms have been well controlled until a few days ago after he came back from vacation.  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  He has difficult time putting pressure on his foot and his stomach tenses up whenever he experienced a foot pain.   Plan: --Refilled Mobic and Robaxin  --Given plantar fascitis exercise to do each morning --Referred to podiatry again --Adviced to get Dr. Margart Sickles inserts and ankle braces temporary until appt with podiatry --Given work restrictions until improvement in symptoms

## 2021-07-19 NOTE — Patient Instructions (Signed)
Thank you, Paul Jarvis for allowing [place] to provide your care today. Today we discussed your plantar fasciitis and muscle spasms.  I have encouraged and document with exercises to do to help relieve some of the pain.    I have place a referrals to podiatry to further evaluate and manage your symptoms.   I have ordered the following medication/changed the following medications:  Refilled your Mobic and Robaxin  My Chart Access: [email or web address]  Please follow-up in 1 month  Please make sure to arrive 15 minutes prior to your next appointment. If you arrive late, you may be asked to reschedule.    We look forward to seeing you next time. Please call our clinic at [phone] if you have any questions or concerns. The best time to call is Monday-Friday from 9am-4pm, but there is someone available 24/7. If after hours or the weekend, call the main hospital number and ask for the Internal Medicine Resident On-Call. If you need medication refills, please notify your pharmacy one week in advance and they will send [place] a request.   Thank you for letting us take part in your care. Wishing you the best!  Steffanie Rainwater, MD 07/19/2021, 11:16 AM IM Resident, PGY-2 [place]ne Heck 41:10
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
## 2021-07-19 NOTE — Assessment & Plan Note (Signed)
BP elevated due to pain in foot. Will need to follow to assess effectiveness of the increased dose of amlodipine-valsartan combo pill. BMP shows normal kidney function Vitals:   07/19/21 1037 07/19/21 1112  BP: (!) 162/94 (!) 143/94   Plan: --Changed amlodipine-valsatan 5-160 mg BID to 10-320 mg daily to improve adherence.  --BMP and BP check at next OV

## 2021-07-20 NOTE — Progress Notes (Signed)
Internal Medicine Clinic Attending  Case discussed with Dr. Amponsah  At the time of the visit.  We reviewed the resident's history and exam and pertinent patient test results.  I agree with the assessment, diagnosis, and plan of care documented in the resident's note.  

## 2021-07-25 ENCOUNTER — Ambulatory Visit: Payer: 59 | Admitting: Podiatry

## 2021-07-25 ENCOUNTER — Encounter: Payer: Self-pay | Admitting: Podiatry

## 2021-07-25 ENCOUNTER — Other Ambulatory Visit: Payer: Self-pay

## 2021-07-25 DIAGNOSIS — M722 Plantar fascial fibromatosis: Secondary | ICD-10-CM

## 2021-07-25 DIAGNOSIS — M79672 Pain in left foot: Secondary | ICD-10-CM | POA: Diagnosis not present
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
## 2021-07-27 ENCOUNTER — Telehealth: Payer: Self-pay | Admitting: Podiatry

## 2021-07-27 DIAGNOSIS — M79676 Pain in unspecified toe(s): Secondary | ICD-10-CM

## 2021-07-27 NOTE — Telephone Encounter (Signed)
Good Morning  Paul Jarvis is filing for disability, due to his plantar fasciitis. Paul Jarvis stated that he has been out of work since 07/18/2021, he came in to see you 07/26/2021. It's a couple of things that I need, 1st his notes would need to be a little more detailed as to why he is not able to work. Paul Jarvis said that he waiting on a MRI and eventually surgery. Also, I need to know how long Paul Jarvis is anticipated to be out of work, due to his diagnosis.

## 2021-08-01 ENCOUNTER — Ambulatory Visit: Payer: 59 | Admitting: Behavioral Health

## 2021-08-01 DIAGNOSIS — F331 Major depressive disorder, recurrent, moderate: Secondary | ICD-10-CM

## 2021-08-01 DIAGNOSIS — F419 Anxiety disorder, unspecified: Secondary | ICD-10-CM
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
## 2021-08-12 ENCOUNTER — Other Ambulatory Visit: Payer: Self-pay

## 2021-08-12 ENCOUNTER — Ambulatory Visit
Admission: RE | Admit: 2021-08-12 | Discharge: 2021-08-12 | Disposition: A | Discharge status: Home / Self Care | Payer: 59 | Source: Ambulatory Visit | Attending: Podiatry | Admitting: Podiatry

## 2021-08-12 DIAGNOSIS — M79672 Pain in left foot: Secondary | ICD-10-CM

## 2021-08-21 ENCOUNTER — Other Ambulatory Visit: Payer: Self-pay

## 2021-08-21 ENCOUNTER — Ambulatory Visit: Payer: 59 | Admitting: Podiatry

## 2021-08-21 ENCOUNTER — Encounter: Payer: Self-pay | Admitting: Podiatry

## 2021-08-21 DIAGNOSIS — M722 Plantar fascial fibromatosis: Secondary | ICD-10-CM
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
## 2021-08-22 ENCOUNTER — Ambulatory Visit: Payer: 59 | Admitting: Behavioral Health

## 2021-08-22 ENCOUNTER — Telehealth: Payer: Self-pay | Admitting: Urology

## 2021-08-22 DIAGNOSIS — F419 Anxiety disorder, unspecified: Secondary | ICD-10-CM

## 2021-08-22 DIAGNOSIS — F331 Major depressive disorder, recurrent, moderate: Secondary | ICD-10-CM
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
## 2021-08-22 NOTE — Telephone Encounter (Signed)
DOS - 09/05/21  PLANTAR FASCIOTOMY LEFT --- 28060   [employer] EFFECTIVE DATE - 07/15/21   PLAN DEDUCTIBLE - $3,000.00 W/ $2,157.02 REMAINING OUT OF POCKET - $6,350.00 W/ $5,138.40 REMAINING COINSURANCE - 50% COPAY - $0.00   PER [employer] WEBSITE FOR CPT CODE 26948 Notification or Prior Authorization is not required for the requested services   Decision ID #:[id number]

## 2021-08-23 ENCOUNTER — Encounter: Payer: Self-pay | Admitting: Podiatry

## 2021-09-11 ENCOUNTER — Encounter: Payer: 59 | Admitting: Podiatry

## 2021-09-14 ENCOUNTER — Institutional Professional Consult (permissible substitution): Payer: 59 | Admitting: Behavioral Health

## 2021-09-25 ENCOUNTER — Telehealth: Payer: Self-pay | Admitting: Podiatry

## 2021-09-25 ENCOUNTER — Encounter: Payer: 59 | Admitting: Podiatry

## 2021-09-25 NOTE — Telephone Encounter (Signed)
I'm calling to get information about my surgery tomorrow. I'm also calling to see if I could talk to Dr. Ralene Cork today as well. You can reach me at [phone]. Thank you. Bye bye.

## 2021-09-26 ENCOUNTER — Encounter: Payer: Self-pay | Admitting: Podiatry

## 2021-09-26 ENCOUNTER — Other Ambulatory Visit: Payer: Self-pay | Admitting: Podiatry

## 2021-09-26 ENCOUNTER — Telehealth: Payer: Self-pay | Admitting: *Deleted

## 2021-09-26 DIAGNOSIS — M722 Plantar fascial fibromatosis: Secondary | ICD-10-CM

## 2021-09-26 MED ORDER — TRAMADOL HCL 50 MG PO TABS: 50.0000 mg | ORAL_TABLET | Freq: Three times a day (TID) | ORAL | 0 refills | Status: AC | PRN

## 2021-09-26 MED ORDER — OXYCODONE HCL 5 MG PO TABS: 5.0000 mg | ORAL_TABLET | ORAL | 0 refills | Status: AC | PRN

## 2021-09-26 MED ORDER — ONDANSETRON HCL 4 MG PO TABS: 4.0000 mg | ORAL_TABLET | Freq: Three times a day (TID) | ORAL | 0 refills | Status: DC | PRN

## 2021-09-26 NOTE — Telephone Encounter (Signed)
Patient is calling because he feels like his bandage is too tight, toes are becoming numb and tingling. Returned the call to patient and went over the post op care of his foot, instructed to loosen the ace wrapping a little to see if it felt better, verbalized understanding.

## 2021-09-29 ENCOUNTER — Telehealth: Payer: Self-pay | Admitting: *Deleted

## 2021-09-29 NOTE — Telephone Encounter (Signed)
Patient's wife is calling because he has gotten his bandages wet , should he come in to have bandages changed,post op appointment is scheduled 12/19? Please advise.

## 2021-09-29 NOTE — Telephone Encounter (Signed)
Patient wife called 2nd time - her husband got the back of his bandage wet when he got shower.  Can they re-wrap the area or will it be okay if just let it be a little damp? Please advise

## 2021-10-02 ENCOUNTER — Other Ambulatory Visit: Payer: Self-pay

## 2021-10-02 ENCOUNTER — Ambulatory Visit (INDEPENDENT_AMBULATORY_CARE_PROVIDER_SITE_OTHER): Payer: 59 | Admitting: Podiatry

## 2021-10-02 ENCOUNTER — Encounter: Payer: Self-pay | Admitting: Podiatry

## 2021-10-02 VITALS — BP 143/93 | HR 86 | Temp 98.0°F

## 2021-10-02 DIAGNOSIS — Z9889 Other specified postprocedural states: Secondary | ICD-10-CM

## 2021-10-02 DIAGNOSIS — M722 Plantar fascial fibromatosis: Secondary | ICD-10-CM
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
## 2021-10-05 ENCOUNTER — Telehealth: Payer: Self-pay | Admitting: *Deleted

## 2021-10-05 NOTE — Telephone Encounter (Signed)
Patient's wife is calling to find out if patient can actually wash his foot. Please advise.

## 2021-10-10 ENCOUNTER — Telehealth: Payer: Self-pay | Admitting: Behavioral Health

## 2021-10-10 ENCOUNTER — Institutional Professional Consult (permissible substitution): Payer: 59 | Admitting: Behavioral Health

## 2021-10-10 NOTE — Telephone Encounter (Signed)
Pt is driving home to [place] from trip to [place]. He admits he forgot our appt time & would prefer to r/s. Told Pt I will have the [employer] r/s his time.  Dr. Monna Fam

## 2021-10-18 ENCOUNTER — Ambulatory Visit (INDEPENDENT_AMBULATORY_CARE_PROVIDER_SITE_OTHER): Payer: 59 | Admitting: Podiatry

## 2021-10-18 ENCOUNTER — Other Ambulatory Visit: Payer: Self-pay

## 2021-10-18 ENCOUNTER — Encounter: Payer: Self-pay | Admitting: Podiatry

## 2021-10-18 DIAGNOSIS — Z9889 Other specified postprocedural states: Secondary | ICD-10-CM

## 2021-10-18 DIAGNOSIS — M722 Plantar fascial fibromatosis: Secondary | ICD-10-CM
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
## 2021-10-24 ENCOUNTER — Telehealth: Payer: Self-pay | Admitting: Podiatry

## 2021-10-24 NOTE — Telephone Encounter (Signed)
Pt left message yesterday with questions about how to go about getting the custom orthotics.  I returned call and we discussed orthotics and he is aware of the cost and is scheduled to see Aaron Edelman 1.17 to be cast for them.   I also sent him a my chart message with the codes so he could check with his insurance to see if they are covered.

## 2021-10-31 ENCOUNTER — Ambulatory Visit: Payer: 59

## 2021-10-31 ENCOUNTER — Other Ambulatory Visit: Payer: Self-pay

## 2021-10-31 ENCOUNTER — Telehealth: Payer: Self-pay

## 2021-10-31 ENCOUNTER — Encounter: Payer: Self-pay | Admitting: Student in an Organized Health Care Education/Training Program

## 2021-10-31 DIAGNOSIS — M722 Plantar fascial fibromatosis: Secondary | ICD-10-CM | POA: Diagnosis not present

## 2021-10-31 NOTE — Progress Notes (Signed)
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 Plantar fasciitis of left foot  M72.2       Current or Previous Devices: None and no history  Foot Examination: Skin presentation:   Intact Ulcers & Callousing:   None and no history Toe / Foot Deformities:  None Weight Bearing Presentation:  Rectus Sensation:    Intact  ORTHOTIC RECOMMENDATION Recommended Device: 1x pair of custom functional foot orthotics  GOALS OF ORTHOSES - Reduce Pain - Prevent Foot Deformity - Prevent Progression of Further Foot Deformity - Relieve Pressure - Improve the Overall Biomechanical Function of the Foot and Lower Extremity.  ACTIONS PERFORMED Patient was casted for Foot Orthoses via crush box. Procedure was explained and patient tolerated procedure well. All questions were answered and concerns addressed.  PLAN Potential out of pocket cost was communicated to patient. Casts are to be sent to [facility] for fabrication. Patient is to be called for fitting when devices are ready.

## 2021-11-02 NOTE — Telephone Encounter (Signed)
Patient stated that he is unable to take the Meloxicam due to him being on blood thinners.  He also wanted to know when he would be returning to work.

## 2021-11-06 ENCOUNTER — Ambulatory Visit: Payer: 59 | Admitting: Behavioral Health

## 2021-11-06 DIAGNOSIS — F439 Reaction to severe stress, unspecified: Secondary | ICD-10-CM

## 2021-11-06 DIAGNOSIS — F319 Bipolar disorder, unspecified: Secondary | ICD-10-CM
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
## 2021-11-08 ENCOUNTER — Encounter: Payer: Self-pay | Admitting: Podiatry

## 2021-11-08 ENCOUNTER — Other Ambulatory Visit: Payer: Self-pay

## 2021-11-08 ENCOUNTER — Ambulatory Visit (INDEPENDENT_AMBULATORY_CARE_PROVIDER_SITE_OTHER): Payer: 59 | Admitting: Podiatry

## 2021-11-08 DIAGNOSIS — Z9889 Other specified postprocedural states: Secondary | ICD-10-CM

## 2021-11-08 DIAGNOSIS — M722 Plantar fascial fibromatosis: Secondary | ICD-10-CM
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
## 2021-11-14 ENCOUNTER — Telehealth: Payer: Self-pay | Admitting: Podiatry

## 2021-11-14 NOTE — Telephone Encounter (Signed)
Pt left message yesterday inquiring about his foot orthotics when they would be ready. He has an appt in the 4 weeks but was asking for disability reasons.  I returned call and was not able to leave a message.

## 2021-11-17 ENCOUNTER — Ambulatory Visit: Payer: 59
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
## 2021-11-20 ENCOUNTER — Encounter: Payer: 59 | Admitting: Student in an Organized Health Care Education/Training Program

## 2021-11-21 ENCOUNTER — Other Ambulatory Visit: Payer: Self-pay

## 2021-11-21 ENCOUNTER — Ambulatory Visit: Payer: 59 | Attending: Podiatry

## 2021-11-21 DIAGNOSIS — R2689 Other abnormalities of gait and mobility: Secondary | ICD-10-CM | POA: Diagnosis present

## 2021-11-21 DIAGNOSIS — M722 Plantar fascial fibromatosis: Secondary | ICD-10-CM | POA: Insufficient documentation

## 2021-11-21 DIAGNOSIS — M6281 Muscle weakness (generalized): Secondary | ICD-10-CM | POA: Diagnosis present

## 2021-11-21 DIAGNOSIS — Z9889 Other specified postprocedural states: Secondary | ICD-10-CM | POA: Diagnosis not present

## 2021-11-21 DIAGNOSIS — M79672 Pain in left foot: Secondary | ICD-10-CM | POA: Insufficient documentation
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
## 2021-11-26 ENCOUNTER — Other Ambulatory Visit: Payer: Self-pay | Admitting: Student

## 2021-11-28 ENCOUNTER — Other Ambulatory Visit: Payer: 59

## 2021-11-29 ENCOUNTER — Other Ambulatory Visit: Payer: Self-pay

## 2021-11-29 ENCOUNTER — Ambulatory Visit: Payer: 59

## 2021-11-29 DIAGNOSIS — R2689 Other abnormalities of gait and mobility: Secondary | ICD-10-CM

## 2021-11-29 DIAGNOSIS — M79672 Pain in left foot: Secondary | ICD-10-CM

## 2021-11-29 DIAGNOSIS — M722 Plantar fascial fibromatosis: Secondary | ICD-10-CM

## 2021-11-29 DIAGNOSIS — M6281 Muscle weakness (generalized): Secondary | ICD-10-CM

## 2021-11-29 NOTE — Progress Notes (Signed)
SITUATION: Reason for Visit: Fitting and Delivery of Custom Fabricated Foot Orthoses Patient Report: Patient reports comfort and is satisfied with device.  OBJECTIVE DATA: Patient History / Diagnosis:     ICD-10-CM   1. Plantar fasciitis  M72.2       Provided Device:  Custom Functional Foot Orthotics     [facility]: [phone] GOAL OF ORTHOSIS - Improve gait - Decrease energy expenditure - Improve Balance - Provide Triplanar stability of foot complex - Facilitate motion  ACTIONS PERFORMED Patient was fit with foot orthotics trimmed to shoe last. Patient tolerated fittign procedure.   Patient was provided with verbal and written instruction and demonstration regarding donning, doffing, wear, care, proper fit, function, purpose, cleaning, and use of the orthosis and in all related precautions and risks and benefits regarding the orthosis.  Patient was also provided with verbal instruction regarding how to report any failures or malfunctions of the orthosis and necessary follow up care. Patient was also instructed to contact our office regarding any change in status that may affect the function of the orthosis.  Patient demonstrated independence with proper donning, doffing, and fit and verbalized understanding of all instructions.  PLAN: Patient is to follow up in one week or as necessary (PRN). All questions were answered and concerns addressed. Plan of care was discussed with and agreed upon by the patient.
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
## 2021-12-04 ENCOUNTER — Ambulatory Visit: Payer: 59 | Admitting: Behavioral Health

## 2021-12-04 DIAGNOSIS — F331 Major depressive disorder, recurrent, moderate: Secondary | ICD-10-CM

## 2021-12-04 DIAGNOSIS — F419 Anxiety disorder, unspecified: Secondary | ICD-10-CM

## 2021-12-04 DIAGNOSIS — M797 Fibromyalgia: Secondary | ICD-10-CM

## 2021-12-04 NOTE — BH Specialist Note (Signed)
Integrated Behavioral Health via Telemedicine Visit  12/04/2021 Rodricus Candelaria [id number]  Number of Integrated Behavioral Health Clinician visits: 8 Session Start time: 1000am Session End time: 1100am Total time in minutes: 60  Referring Provider: Frederic Jericho, LCSW Patient/Family location: Pt is @ Chiropractor visit & requests a RC @ 1030am [facility] Provider location: [facility] Office All persons participating in visit: Pt & Clinician  Types of Service: Individual psychotherapy  I connected with [name] and/or [name]  self  via  Telephone or Video Enabled Telemedicine Application  (Video is Caregility application) and verified that I am speaking with the correct person using two identifiers. Discussed confidentiality: Yes   I discussed the limitations of telemedicine and the availability of in person appointments.  Discussed there is a possibility of technology failure and discussed alternative modes of communication if that failure occurs.  I discussed that engaging in this telemedicine visit, they consent to the provision of behavioral healthcare and the services will be billed under their insurance.  Patient and/or legal guardian expressed understanding and consented to Telemedicine visit:  8th visit  Presenting Concerns: Patient and/or family reports the following symptoms/concerns: elevated anx/dep due to  Duration of problem: yrs, but acute anx rxn more recently; Severity of problem: moderate & can trend severe if stressors are sudden & extreme (even pos stressor of expecting New Baby)  Patient and/or Family's Strengths/Protective Factors: Social connections, Social and Emotional competence, Concrete supports in place (healthy food, safe environments, etc.), Sense of purpose, Physical Health (exercise, healthy diet, medication compliance, etc.), Caregiver has knowledge of parenting & child development, and Parental Resilience  Goals Addressed: Patient will:  Reduce symptoms  of: anxiety, depression, and stress   Increase knowledge and/or ability of: coping skills and stress reduction   Demonstrate ability to: Increase healthy adjustment to current life circumstances  Progress towards Goals: Ongoing  Interventions: Interventions utilized:  Solution-Focused Strategies and Supportive Counseling Standardized Assessments completed:  screeners prn  Patient and/or Family Response: Pt receptive to call today, but did not remember it was this morning. Pt requests future appt in 2 wks.  Assessment: Patient currently experiencing relapsing anx rxn due to need for adjustment to health status challenges/changes & adjustment to expectations that New Baby will bring much change w/good results!  Patient may benefit from cont'g sessions to support, encourage & provide Pt w/resources to manage his stressload, anx/dep, & health issues.  Plan: Follow up with behavioral health clinician on : 2 wks for 60 min on telehealth in the afternoon Behavioral recommendations: Use distraction techniques as suggested when pain in foot gets worse. Cont using the PT exercises as intructed on visits. Care for self using the suggestions provided & remember the paragraph I send to keep your growth grounded. Referral(s): Integrated [employer] (In Clinic)  I discussed the assessment and treatment plan with the patient and/or parent/guardian. They were provided an opportunity to ask questions and all were answered. They agreed with the plan and demonstrated an understanding of the instructions.   They were advised to call back or seek an in-person evaluation if the symptoms worsen or if the condition fails to improve as anticipated.  Deneise Lever, LMFT

## 2021-12-06 ENCOUNTER — Telehealth: Payer: Self-pay | Admitting: Podiatry

## 2021-12-06 ENCOUNTER — Ambulatory Visit: Payer: 59

## 2021-12-06 ENCOUNTER — Encounter: Payer: Self-pay | Admitting: Podiatry

## 2021-12-06 ENCOUNTER — Other Ambulatory Visit: Payer: Self-pay

## 2021-12-06 DIAGNOSIS — R2689 Other abnormalities of gait and mobility: Secondary | ICD-10-CM

## 2021-12-06 DIAGNOSIS — M6281 Muscle weakness (generalized): Secondary | ICD-10-CM

## 2021-12-06 DIAGNOSIS — M79672 Pain in left foot: Secondary | ICD-10-CM

## 2021-12-06 NOTE — Telephone Encounter (Signed)
Made pt aware ok for pt to stop in the office and get a note done at the front desk.   Pt needs light duty note written please.

## 2021-12-06 NOTE — Telephone Encounter (Signed)
Pt called asking for a note based on his physical therapy eval for light duty at work.
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
## 2021-12-13 ENCOUNTER — Other Ambulatory Visit: Payer: Self-pay

## 2021-12-13 ENCOUNTER — Ambulatory Visit: Payer: 59 | Attending: Podiatry

## 2021-12-13 DIAGNOSIS — R2689 Other abnormalities of gait and mobility: Secondary | ICD-10-CM | POA: Insufficient documentation

## 2021-12-13 DIAGNOSIS — M79672 Pain in left foot: Secondary | ICD-10-CM | POA: Diagnosis not present

## 2021-12-13 DIAGNOSIS — M6281 Muscle weakness (generalized): Secondary | ICD-10-CM | POA: Diagnosis present
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
## 2021-12-18 ENCOUNTER — Other Ambulatory Visit: Payer: Self-pay

## 2021-12-18 ENCOUNTER — Ambulatory Visit: Payer: 59 | Admitting: Behavioral Health

## 2021-12-18 DIAGNOSIS — F331 Major depressive disorder, recurrent, moderate: Secondary | ICD-10-CM

## 2021-12-18 DIAGNOSIS — F419 Anxiety disorder, unspecified: Secondary | ICD-10-CM

## 2021-12-18 NOTE — BH Specialist Note (Signed)
Integrated Behavioral Health via Telemedicine Visit ? ?12/18/2021 ?Yuval Mahajan ?[id number] ? ?Number of [facility] Clinician visits: 9 ?Session Start time: 3:00pm ?Session End time: 3:30pm ?Total time in minutes: 30 min ? ?Referring Provider: Dessie Coma, [facility] ?Patient/Family location: Pt is home w/his chldren ?[facility] Provider location: Working remotely ?All persons participating in visit: Pt & Clinician ?Types of Service: Individual psychotherapy ? ?I connected with Johnnye Sima and/or Franklyn Lor  self  via  Telephone or Video Enabled Telemedicine Application  (Video is Caregility application) and verified that I am speaking with the correct person using two identifiers. Discussed confidentiality: Yes  ? ?I discussed the limitations of telemedicine and the availability of in person appointments.  Discussed there is a possibility of technology failure and discussed alternative modes of communication if that failure occurs. ? ?I discussed that engaging in this telemedicine visit, they consent to the provision of behavioral healthcare and the services will be billed under their insurance. ? ?Patient and/or legal guardian expressed understanding and consented to Telemedicine visit: Yes  ? ?Presenting Concerns: ?Patient and/or family reports the following symptoms/concerns: elevated anx/dep due to caring for Family when he & Wife are under the weather ?Duration of problem: acutely today; Severity of problem: moderate ? ?Patient and/or Family's Strengths/Protective Factors: ?Social and Emotional competence, Concrete supports in place (healthy food, safe environments, etc.), and Sense of purpose ? ?Goals Addressed: ?Patient will: ? Reduce symptoms of: anxiety, depression, and stress  ? Increase knowledge and/or ability of: coping skills and stress reduction  ? Demonstrate ability to: Increase healthy adjustment to current life circumstances ? ?Progress towards  Goals: ?Ongoing ? ?Interventions: ?Interventions utilized:  Solution-Focused Strategies, Mindfulness or Relaxation Training, and self-care practices for both Parents who are feeling sick today w/3 young children (ages 5, 21, & 43) in the home to be cared for; provided many suggestions for keeping Adult expectations of self minimal while Pt is in pain & Wife is exp'g morning sickness today. ? ?Emphasized need for Pt to schedule w/[facility] for a PCP visit to address his pain issues. ?Standardized Assessments completed: Not Needed ? ?Patient and/or Family Response: Pt is receptive to call today & agrees to future appts ? ?Assessment: ?Patient currently experiencing chronic pain issues; he is sore & aches all over. His energy is low & he feels fatigued. Parents are working as a tag-team to meet needs of the children. ? ?Patient may benefit from cont'd support & encouragement for young Family dvlpmnt. ? ?Plan: ?Follow up with behavioral health clinician on : 2 wks for 60 min on telehealth ?Behavioral recommendations: Try to keep engagement w/the children to a monitor/supervisor perspective so you & Wife Lela can rest as much as possible. Try to be disciplined about your use of technology. ?Referral(s): [place] (In Clinic) ? ?I discussed the assessment and treatment plan with the patient and/or parent/guardian. They were provided an opportunity to ask questions and all were answered. They agreed with the plan and demonstrated an understanding of the instructions. ?  ?They were advised to call back or seek an in-person evaluation if the symptoms worsen or if the condition fails to improve as anticipated. ? ?Donnetta Hutching, LMFT ?

## 2021-12-19 ENCOUNTER — Telehealth: Payer: Self-pay | Admitting: Podiatry

## 2021-12-19 NOTE — Telephone Encounter (Signed)
Pt. Was scheduled to return to work 12/12/2021, but he did not return, he said his job does not offer light duty work. He also said that he just re-injured his foot. I have long term disability paperwork for him, how long should I extend his leave. He is scheduled to see you on 3/15 ?

## 2021-12-20 ENCOUNTER — Ambulatory Visit: Payer: 59

## 2021-12-20 ENCOUNTER — Other Ambulatory Visit: Payer: Self-pay

## 2021-12-20 DIAGNOSIS — M79672 Pain in left foot: Secondary | ICD-10-CM | POA: Diagnosis not present

## 2021-12-20 DIAGNOSIS — M6281 Muscle weakness (generalized): Secondary | ICD-10-CM

## 2021-12-20 DIAGNOSIS — R2689 Other abnormalities of gait and mobility: Secondary | ICD-10-CM
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
## 2021-12-25 ENCOUNTER — Ambulatory Visit (INDEPENDENT_AMBULATORY_CARE_PROVIDER_SITE_OTHER): Payer: 59 | Admitting: Student in an Organized Health Care Education/Training Program

## 2021-12-25 ENCOUNTER — Encounter: Payer: Self-pay | Admitting: Student in an Organized Health Care Education/Training Program

## 2021-12-25 VITALS — BP 134/91 | HR 94 | Temp 98.0°F | Ht 69.0 in | Wt 247.8 lb

## 2021-12-25 DIAGNOSIS — E669 Obesity, unspecified: Secondary | ICD-10-CM | POA: Diagnosis not present

## 2021-12-25 DIAGNOSIS — I10 Essential (primary) hypertension: Secondary | ICD-10-CM

## 2021-12-25 DIAGNOSIS — G4733 Obstructive sleep apnea (adult) (pediatric): Secondary | ICD-10-CM | POA: Diagnosis not present

## 2021-12-25 DIAGNOSIS — Z1159 Encounter for screening for other viral diseases: Secondary | ICD-10-CM

## 2021-12-25 DIAGNOSIS — M797 Fibromyalgia: Secondary | ICD-10-CM

## 2021-12-25 DIAGNOSIS — E782 Mixed hyperlipidemia: Secondary | ICD-10-CM

## 2021-12-25 DIAGNOSIS — M722 Plantar fascial fibromatosis: Secondary | ICD-10-CM

## 2021-12-25 LAB — POCT GLYCOSYLATED HEMOGLOBIN (HGB A1C): Hemoglobin A1C: 5 % (ref 4.0–5.6)

## 2021-12-25 LAB — GLUCOSE, CAPILLARY: Glucose-Capillary: 108 mg/dL — ABNORMAL HIGH (ref 70–99)

## 2021-12-25 MED ORDER — DULOXETINE HCL 60 MG PO CPEP: 60.0000 mg | ORAL_CAPSULE | Freq: Every day | ORAL | 3 refills | Status: DC

## 2021-12-25 NOTE — Assessment & Plan Note (Signed)
Weight up 10lbs since last year, now 247 lb with BMP of 36. Likely this is related to recent foot surgery, stress at home, and less exertion at work. We talked about diet and nutrition for a time. Will check A1c, TSH, and lipids today. I think obesity is going to make OSA and fibromyalgia control difficult. May be a candidate for GLP1 agonist in the future, especially if he has hyperglycemia. ?

## 2021-12-25 NOTE — Assessment & Plan Note (Signed)
Blood pressure well controlled today at 134/91. Plan to continue with combination amlodipine-valsartan. Will check BMP today. ?

## 2021-12-25 NOTE — Progress Notes (Signed)
? ?  Assessment and Plan: ? ?See Encounters tab for problem-based medical decision making.  ? ?__________________________________________________________ ? ?HPI: ? ? ?31 year old person here for follow up of hypertension and fibromyalgia. Since I last saw him he has struggled with left foot plantar fasciitis, he had left foot plantar fasciotomy in early January and has been going through physical therapy. He reports some improvement in pain, still limited ambulation, using a boot. He has been away from work since October. He has three young children, he and wife are expecting, she is having a lot of morning sickness and he has been very active with his kids which puts a lot of stress on him while he is recovering from surgery. He reports good adherence with medications, no side effects.  ? ?__________________________________________________________ ? ?Problem List: ?Patient Active Problem List  ? Diagnosis Date Noted  ? Fibromyalgia syndrome 11/21/2020  ?  Priority: High  ? Hypertension 07/15/2020  ?  Priority: High  ? Obesity, Class II, BMI 35-39.9 12/25/2021  ?  Priority: Medium   ? Obstructive sleep apnea 11/21/2020  ?  Priority: Medium   ? Plantar fasciitis of left foot 10/04/2020  ?  Priority: Medium   ? Hyperlipidemia 10/04/2020  ?  Priority: Medium   ? Bipolar depression ([facility]) 11/17/2015  ?  Priority: Medium   ? Healthcare maintenance 11/17/2015  ?  Priority: Low  ? ? ?Medications: Reconciled today in Epic ?__________________________________________________________ ? ?Physical Exam:  ?Vital Signs: ?Vitals:  ? 12/25/21 0924  ?BP: (!) 134/91  ?Pulse: 94  ?Temp: 98 ?F (36.7 ?C)  ?TempSrc: Oral  ?SpO2: 99%  ?Weight: 247 lb 12.8 oz (112.4 kg)  ?Height: 5\' 9"  (1.753 m)  ? ? ?Gen: Well appearing, NAD ?Neck: No cervical LAD, mild diffuse thyromegaly without nodules ?CV: RRR, no murmurs ?Pulm: Normal effort, CTA throughout, no wheezing ?Ext: Warm, no edema, normal joints ?Neuro: alert, conversational, full strength  throughout ? ? ?

## 2021-12-25 NOTE — Assessment & Plan Note (Signed)
History of chronic pain syndrome with multiple joints involved since 2012, negative workup including imaging and serologies. We diagnosed him with fibromyalgia in 2022 and started treatment with duloxetine which is going ok. Remains somewhat function limited intermittently, working through severe plantar fasciitis right now. Will continue with duloxetine and CBT with Dr. Theodis Shove. ?

## 2021-12-25 NOTE — Assessment & Plan Note (Signed)
Mild OSA diagnosed in 2020 at [facility] but then lost to follow up. He has gained 10 lbs, having more symptoms of daytime somnolence and not feeling restorative sleep in the morning, so I anticipate this problem is a little worse now. He is willing to try CPAP therapy. I will refer him back to [facility], last saw Dr. Frances Furbish, to either try self-titrating CPAP or go for a repeat home sleep test. ?

## 2021-12-25 NOTE — Assessment & Plan Note (Addendum)
Plan to check lipids today. Patient is at risk for metabolic syndrome. ?

## 2021-12-25 NOTE — Patient Instructions (Signed)
It was great seeing you in clinic today. We talked about several issues: ? ?You have a 10 lb weight gain, we talked about changes to your diet and exercise plan ?Your blood pressure is good, we will check your blood work as well ?We talked about using NSAIDs only as needed, on the bad days. Try not to use them every day. ?Continue your other medications ?

## 2021-12-25 NOTE — Assessment & Plan Note (Signed)
Managed by [facility], now post-surgery and 5 weeks into PT course with some improvements in function. We talked about careful use of NSAIDs given use of olmesartan for hypertension. He is going to continue PT, we talked about reasonable expectations and focus on function in the future. ?

## 2021-12-26 LAB — BMP8+ANION GAP
Anion Gap: 17 mmol/L (ref 10.0–18.0)
BUN/Creatinine Ratio: 13 (ref 9–20)
BUN: 18 mg/dL (ref 6–20)
CO2: 19 mmol/L — ABNORMAL LOW (ref 20–29)
Calcium: 9.3 mg/dL (ref 8.7–10.2)
Chloride: 102 mmol/L (ref 96–106)
Creatinine, Ser: 1.37 mg/dL — ABNORMAL HIGH (ref 0.76–1.27)
Glucose: 104 mg/dL — ABNORMAL HIGH (ref 70–99)
Potassium: 4.2 mmol/L (ref 3.5–5.2)
Sodium: 138 mmol/L (ref 134–144)
eGFR: 70 mL/min/{1.73_m2} (ref >59)

## 2021-12-26 LAB — HCV INTERPRETATION

## 2021-12-26 LAB — LIPID PANEL
Chol/HDL Ratio: 7.8 ratio — ABNORMAL HIGH (ref 0.0–5.0)
Cholesterol, Total: 243 mg/dL — ABNORMAL HIGH (ref 100–199)
HDL: 31 mg/dL — ABNORMAL LOW (ref >39)
LDL Chol Calc (NIH): 135 mg/dL — ABNORMAL HIGH (ref 0–99)
Triglycerides: 424 mg/dL — ABNORMAL HIGH (ref 0–149)
VLDL Cholesterol Cal: 77 mg/dL — ABNORMAL HIGH (ref 5–40)

## 2021-12-26 LAB — HCV AB W REFLEX TO QUANT PCR: HCV Ab: NONREACTIVE

## 2021-12-26 LAB — TSH: TSH: 1.16 u[IU]/mL (ref 0.450–4.500)

## 2021-12-27 ENCOUNTER — Ambulatory Visit: Payer: 59

## 2021-12-27 ENCOUNTER — Other Ambulatory Visit: Payer: Self-pay

## 2021-12-27 ENCOUNTER — Ambulatory Visit (INDEPENDENT_AMBULATORY_CARE_PROVIDER_SITE_OTHER): Payer: 59 | Admitting: Podiatry

## 2021-12-27 DIAGNOSIS — M79672 Pain in left foot: Secondary | ICD-10-CM

## 2021-12-27 DIAGNOSIS — Z9889 Other specified postprocedural states: Secondary | ICD-10-CM

## 2021-12-27 DIAGNOSIS — R2689 Other abnormalities of gait and mobility: Secondary | ICD-10-CM

## 2021-12-27 DIAGNOSIS — M6281 Muscle weakness (generalized): Secondary | ICD-10-CM
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
## 2022-01-03 ENCOUNTER — Other Ambulatory Visit: Payer: Self-pay

## 2022-01-03 ENCOUNTER — Ambulatory Visit: Payer: 59

## 2022-01-03 DIAGNOSIS — M79672 Pain in left foot: Secondary | ICD-10-CM

## 2022-01-03 DIAGNOSIS — R2689 Other abnormalities of gait and mobility: Secondary | ICD-10-CM

## 2022-01-03 DIAGNOSIS — M6281 Muscle weakness (generalized): Secondary | ICD-10-CM
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
## 2022-01-10 ENCOUNTER — Ambulatory Visit: Payer: 59

## 2022-01-10 DIAGNOSIS — M6281 Muscle weakness (generalized): Secondary | ICD-10-CM

## 2022-01-10 DIAGNOSIS — M79672 Pain in left foot: Secondary | ICD-10-CM

## 2022-01-10 DIAGNOSIS — R2689 Other abnormalities of gait and mobility: Secondary | ICD-10-CM
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
## 2022-01-15 ENCOUNTER — Ambulatory Visit: Payer: 59 | Admitting: Behavioral Health

## 2022-01-15 DIAGNOSIS — F419 Anxiety disorder, unspecified: Secondary | ICD-10-CM

## 2022-01-15 DIAGNOSIS — F331 Major depressive disorder, recurrent, moderate: Secondary | ICD-10-CM

## 2022-01-15 NOTE — BH Specialist Note (Signed)
Integrated Behavioral Health via Telemedicine Visit ? ?01/15/2022 ?Dustin Bumbaugh ?[id number] ? ?Number of Integrated Behavioral Health Clinician visits: 10 ?Session Start time: 1100 ?Session End time: 1130 ?Total time in minutes: 30 min ? ?Referring Provider: [name], [facility] ?Patient/Family location: Pt is home in private ?[facility] Provider location: [facility] ?All persons participating in visit: Pt & Clinician ?Types of Service: Individual psychotherapy ? ?I connected with [name] and/or [name]  self  via  Telephone or Video Enabled Telemedicine Application  (Video is Caregility application) and verified that I am speaking with the correct person using two identifiers. Discussed confidentiality: Yes  ? ?I discussed the limitations of telemedicine and the availability of in person appointments.  Discussed there is a possibility of technology failure and discussed alternative modes of communication if that failure occurs. ? ?I discussed that engaging in this telemedicine visit, they consent to the provision of behavioral healthcare and the services will be billed under their insurance. ? ?Patient and/or legal guardian expressed understanding and consented to Telemedicine visit: Yes  ? ?Presenting Concerns: ?Patient and/or family reports the following symptoms/concerns: Pt is trying to heal his foot being w/children @ home has helped him learn about himself. 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 ?Duration of problem: months; Severity of problem: mild ? ?Patient and/or Family's Strengths/Protective Factors: ?Social and Emotional competence, Concrete supports in place (healthy food, safe environments, etc.), Sense of purpose, and Physical Health (exercise, healthy diet, medication compliance, etc.) ? ?Goals Addressed: ?Patient will: ? Reduce symptoms of: anxiety, depression, and stress  ? Increase  knowledge and/or ability of: coping skills, healthy habits, and stress reduction  ? Demonstrate ability to: Increase healthy adjustment to current life circumstances ? ?Progress towards Goals: ?Ongoing ? ?Interventions: ?Interventions utilized:  Solution-Focused Strategies and Supportive Counseling ?Standardized Assessments completed:  screeners prn ? ?Patient and/or Family Response: Pt is receptive to call, but did not receive a reminder call per Pt report.  ? ?Assessment: ?Patient currently experiencing dec in anx/dep & inc in Fatherhood.  ? ?Patient may benefit from resources that encourage, support, & fortify Pt in his Fatherhood journey. ? ?Plan: ?Follow up with behavioral health clinician on : Next avail 30 min telehealth  ?Behavioral recommendations: Contact Edmund Hilda, Fatherhood Specialist about further resources & for his guidance as needed. Find a Church home like you & Wife need so children can also have this support. ?Referral(s):  [facility], [place], [facility]or[facility] ? ?I discussed the assessment and treatment plan with the patient and/or parent/guardian. They were provided an opportunity to ask questions and all were answered. They agreed with the plan and demonstrated an understanding of the instructions. ?  ?They were advised to call back or seek an in-person evaluation if the symptoms worsen or if the condition fails to improve as anticipated. ? ?Deneise Lever, LMFT ?

## 2022-01-17 ENCOUNTER — Ambulatory Visit: Payer: 59 | Attending: Podiatry

## 2022-01-17 DIAGNOSIS — M79672 Pain in left foot: Secondary | ICD-10-CM | POA: Diagnosis present

## 2022-01-17 DIAGNOSIS — R2689 Other abnormalities of gait and mobility: Secondary | ICD-10-CM | POA: Diagnosis present

## 2022-01-17 DIAGNOSIS — M6281 Muscle weakness (generalized): Secondary | ICD-10-CM | POA: Insufficient documentation
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
## 2022-01-23 ENCOUNTER — Telehealth: Payer: Self-pay | Admitting: Podiatry

## 2022-01-23 NOTE — Telephone Encounter (Signed)
Patient said that Physical Therapy said that he is not ready to return to work. He is scheduled to return to work 01/24/2022. Should he be extended or is patient ready to return to work. Please advise? ?

## 2022-01-24 ENCOUNTER — Telehealth: Payer: Self-pay | Admitting: Neurology

## 2022-01-24 ENCOUNTER — Telehealth: Payer: Self-pay | Admitting: Podiatry

## 2022-01-24 NOTE — Telephone Encounter (Signed)
Leave extended until 04/02/2022, patient is scheduled for a re-check on 03/07/2022. Patient still in surgical shoe and unable to put on regular shoes. Per Physical Therapy, patient needs to continue home PT. ?

## 2022-01-24 NOTE — Telephone Encounter (Signed)
Spoke to patient per Dr Frances Furbish pt has to come in for a office visit. Made patient appointment for May 17 @ 11:15 am . Informed patient not aware if he has to do sleep study again depending on insurance. Pt expressed understanding and thanked me for calling  ?

## 2022-01-24 NOTE — Telephone Encounter (Signed)
Wife is asking for a call to ask about a new CPAP for pt without going thru another sleep study.  Phone rep offered to schedule a f/u 1st since it has been 2 years since last in office, wife would like a call from RN 1st ?

## 2022-02-07 ENCOUNTER — Telehealth: Payer: Self-pay | Admitting: Neurology

## 2022-02-07 NOTE — Telephone Encounter (Signed)
Rescheduled 5/17 appointment with patient over the phone (MD out). ?

## 2022-02-14 ENCOUNTER — Ambulatory Visit: Payer: Self-pay | Admitting: Behavioral Health

## 2022-02-28 ENCOUNTER — Ambulatory Visit: Payer: 59 | Admitting: Neurology

## 2022-03-05 ENCOUNTER — Encounter: Payer: Self-pay | Admitting: Neurology

## 2022-03-05 ENCOUNTER — Ambulatory Visit (INDEPENDENT_AMBULATORY_CARE_PROVIDER_SITE_OTHER): Payer: Self-pay | Admitting: Neurology

## 2022-03-05 VITALS — BP 135/87 | HR 83 | Ht 69.0 in | Wt 240.0 lb

## 2022-03-05 DIAGNOSIS — G4733 Obstructive sleep apnea (adult) (pediatric): Secondary | ICD-10-CM
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
## 2022-03-05 NOTE — Patient Instructions (Signed)
It was nice to see you again today.  As discussed, we will set you up at home with a so called autoPAP machine for treatment of your obstructive sleep apnea.  Once you get your machine, please contact us so we can set up a follow-up appointment.  We have to meet a certain insurance requirement for compliance and also the window for follow-up.  While your insurance may require that you use your PAP machine at least 4 hours each night on 70% of the nights, I recommend, that you not skip any nights and use it throughout the night if you can. Getting used to PAP and staying with the treatment long term does take time and patience and discipline. Untreated obstructive sleep apnea when it is moderate to severe can have an adverse impact on cardiovascular health and raise her risk for heart disease, arrhythmias, hypertension, congestive heart failure, stroke and diabetes. Untreated obstructive sleep apnea causes sleep disruption, nonrestorative sleep, and sleep deprivation. This can have an impact on your day to day functioning and cause daytime sleepiness and impairment of cognitive function, memory loss, mood disturbance, and problems focussing. Using PAP regularly can improve these symptoms. We will need to see you back within 31 to 89 days within set up of your new machine.

## 2022-03-07 ENCOUNTER — Ambulatory Visit (INDEPENDENT_AMBULATORY_CARE_PROVIDER_SITE_OTHER): Payer: Self-pay | Admitting: Podiatry

## 2022-03-07 DIAGNOSIS — Z91199 Patient's noncompliance with other medical treatment and regimen due to unspecified reason: Secondary | ICD-10-CM

## 2022-03-07 NOTE — Progress Notes (Signed)
No show

## 2022-03-14 ENCOUNTER — Ambulatory Visit: Payer: Medicaid Other | Admitting: Behavioral Health

## 2022-03-14 DIAGNOSIS — F419 Anxiety disorder, unspecified: Secondary | ICD-10-CM

## 2022-03-14 DIAGNOSIS — F330 Major depressive disorder, recurrent, mild: Secondary | ICD-10-CM

## 2022-03-14 NOTE — BH Specialist Note (Signed)
Integrated Behavioral Health via Telemedicine Visit  03/14/2022 Paul Jarvis [id number]  Number of Integrated Behavioral Health Clinician visits: 11 Session Start time:  1400 Session End time: 1430 Total time in minutes: 30 min  Referring Provider: [name], [facility] Patient/Family location: Pt is home in private Paul Jarvis Provider location: [facility] All persons participating in visit: PT & Clinician Types of Service: Individual psychotherapy  I connected with Paul Jarvis and/or Paul Jarvis  self  via  Telephone or Video Enabled Telemedicine Application  (Video is Caregility application) and verified that I am speaking with the correct person using two identifiers. Discussed confidentiality: Yes   I discussed the limitations of telemedicine and the availability of in person appointments.  Discussed there is a possibility of technology failure and discussed alternative modes of communication if that failure occurs.  I discussed that engaging in this telemedicine visit, they consent to the provision of behavioral healthcare and the services will be billed under their insurance.  Patient and/or legal guardian expressed understanding and consented to Telemedicine visit: Yes   Presenting Concerns: Patient and/or family reports the following symptoms/concerns: reduction in anx/dep as he has an upcoming Interview for a new job. Family is expecting a new Baby in 3 mos & Pt is positive about any needed transition from Clinician's care. Duration of problem: yrs of dealing w/pain of Fibromyalgia & other health status changes  Pt has been out of work & now has secured new Ins. coverage  Severity of problem: mild  Patient and/or Family's Strengths/Protective Factors: Social connections, Social and Emotional competence, Concrete supports in place (healthy food, safe environments, etc.), Sense of purpose, and Physical Health (exercise, healthy diet, medication compliance, etc.)  Goals  Addressed: Patient will:  Reduce symptoms of: anxiety, depression, and stress   Increase knowledge and/or ability of: coping skills and stress reduction   Demonstrate ability to: Increase healthy adjustment to current life circumstances Pt hopes to prepare for upcoming Interview adequately Pt hopes to get his healthcare back on track w/new Ins  Progress towards Goals: Ongoing  Interventions: Interventions utilized:  Solution-Focused Strategies and Supportive Counseling Standardized Assessments completed:  screeners prn  Patient and/or Family Response: Pt is receptive to call today.  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 Pt hopes to work for the [employer] soon.   Patient may benefit from cont'd Cslg w/this or an alternative Therapist to address PTSD issues & Family Hx.  Plan: Follow up with behavioral health clinician on : end of June 2023 for final session Behavioral recommendations: Explore alternatives for psychotherapy @ your discretion Referral(s): Integrated Behavioral Health Services (In Clinic) and Psychology Today website  I discussed the assessment and treatment plan with the patient and/or parent/guardian. They were provided an opportunity to ask questions and all were answered. They agreed with the plan and demonstrated an understanding of the instructions.   They were advised to call back or seek an in-person evaluation if the symptoms worsen or if the condition fails to improve as anticipated.  Deneise Lever, LMFT

## 2022-03-19 ENCOUNTER — Ambulatory Visit (INDEPENDENT_AMBULATORY_CARE_PROVIDER_SITE_OTHER): Payer: Commercial Managed Care - HMO | Admitting: Student in an Organized Health Care Education/Training Program

## 2022-03-19 ENCOUNTER — Encounter: Payer: Self-pay | Admitting: Student in an Organized Health Care Education/Training Program

## 2022-03-19 VITALS — BP 155/109 | HR 71 | Temp 98.0°F | Ht 69.0 in | Wt 240.9 lb

## 2022-03-19 DIAGNOSIS — I10 Essential (primary) hypertension: Secondary | ICD-10-CM | POA: Diagnosis not present

## 2022-03-19 DIAGNOSIS — M722 Plantar fascial fibromatosis: Secondary | ICD-10-CM | POA: Diagnosis not present

## 2022-03-19 DIAGNOSIS — M797 Fibromyalgia: Secondary | ICD-10-CM

## 2022-03-19 MED ORDER — MELOXICAM 15 MG PO TBDP: 15.0000 mg | ORAL_TABLET | Freq: Every day | ORAL | 2 refills | Status: DC | PRN

## 2022-03-19 MED ORDER — DULOXETINE HCL 60 MG PO CPEP: 60.0000 mg | ORAL_CAPSULE | Freq: Every day | ORAL | 3 refills | Status: DC

## 2022-03-19 MED ORDER — AMLODIPINE BESYLATE-VALSARTAN 10-320 MG PO TABS: 1.0000 | ORAL_TABLET | Freq: Every day | ORAL | 3 refills | Status: DC

## 2022-03-19 NOTE — Assessment & Plan Note (Signed)
Worsening pain in his left foot the last week. He is wearing a supportive surgical boot. Exam of the foot is reassuring, no swelling or inflammation. I think his pain sensation is worsened by untreated fibromyalgia currently. He will continue with supportive foot wear and will restart Cymbalta today.

## 2022-03-19 NOTE — Patient Instructions (Signed)
Today we discussed fibromyalgia and hypertension. I have refilled your medications, please let me know if you have any trouble accessing those medicines. Please follow up with the sleep apnea clinic to order the CPAP machine with your new insurance information.

## 2022-03-19 NOTE — Assessment & Plan Note (Signed)
Pain sensation has been worse the last one month for several reasons, he had a lapse in health insurance so lost access to Cymbalta, also under more pressure at home with young children, pregnant wife, transitions at work, untreated sleep apnea also contributing. He is doing well with making plans to address these issues. He now has health care coverage, so will restart Cymbalta 60mg  daily. Also can u se meloxicam 15mg  prn, discussed not taking it more than two days in a row to reduce side effects. If needs more support in the future, could try flexeril or lyrica.

## 2022-03-19 NOTE — Assessment & Plan Note (Signed)
Blood pressure not at goal today due to loosing access to antihypertensive medication. He had a lapse in health insurance, but coverage restarted 6/1. I will send new Rx of amlodipine-valsartan combination, take one tablet daily. Last BMP was appropriate. Follow up with me in 6 months and will repeat labs then.

## 2022-03-19 NOTE — Progress Notes (Signed)
Established Patient Office Visit  Subjective   Patient ID: Paul Jarvis, male    DOB: 09/15/1991  Age: 31 y.o. MRN: [id number]  Chief Complaint  Patient presents with   Medication Refill   Foot Pain   Shoulder Pain   Wrist Pain    HPI  31 year old person her for follow up of fibromyalgia. He has been struggling the last month, he lost health insurance after leaving his prior job, so he has not had medications for about one month. He reports worsening of widespread pain over that time. Today he has pain in his right wrist and left foot. He had a fascial release surgery on the left foot about 6 months ago for plantar fasciitis, reports that his symptoms improved for several months but has been worse the last week, he thinks he overdid it with walking recently. He has three young children at home that he cares for, which is stressful. His wife is pregnant, but has had some complications and is mostly bed bound for the remainder of the pregnancy, they are due in October. So a lot of pressure is on him recently. 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 No recent illness, no fevers or chills, no ED visits or hospitalizations. No falls or trauma, but often very sore after playing with the kids.     Objective:     BP (!) 155/109 (BP Location: Right Arm, Patient Position: Sitting, Cuff Size: Normal)   Pulse 71   Temp 98 F (36.7 C) (Oral)   Ht 5\' 9"  (1.753 m)   Wt 240 lb 14.4 oz (109.3 kg)   SpO2 100%   BMI 35.57 kg/m    Physical Exam  Gen: well appearing man, no distress Neck: diffusely enlarged thyroid gland, no nodules CV: RRR, no murmurs Lungs: unlabored, clear throughout Ext: normal right wrist with no effusion, moderate discomfort with palpation and range of motion, normal appearing left foot with no swelling/redness/or synovitis Psych: tired appearing, but not depressed or anxious appearing.    Assessment & Plan:   Problem List Items Addressed This  Visit       High   Hypertension - Primary (Chronic)    Blood pressure not at goal today due to loosing access to antihypertensive medication. He had a lapse in health insurance, but coverage restarted 6/1. I will send new Rx of amlodipine-valsartan combination, take one tablet daily. Last BMP was appropriate. Follow up with me in 6 months and will repeat labs then.        Relevant Medications   amLODipine-valsartan (EXFORGE) 10-320 MG tablet   Fibromyalgia syndrome (Chronic)    Pain sensation has been worse the last one month for several reasons, he had a lapse in health insurance so lost access to Cymbalta, also under more pressure at home with young children, pregnant wife, transitions at work, untreated sleep apnea also contributing. He is doing well with making plans to address these issues. He now has health care coverage, so will restart Cymbalta 60mg  daily. Also can u se meloxicam 15mg  prn, discussed not taking it more than two days in a row to reduce side effects. If needs more support in the future, could try flexeril or lyrica.        Relevant Medications   DULoxetine (CYMBALTA) 60 MG capsule   Meloxicam 15 MG TBDP     Medium    Plantar fasciitis of left foot (Chronic)    Worsening pain in  his left foot the last week. He is wearing a supportive surgical boot. Exam of the foot is reassuring, no swelling or inflammation. I think his pain sensation is worsened by untreated fibromyalgia currently. He will continue with supportive foot wear and will restart Cymbalta today.         Return in about 6 months (around 09/18/2022).    Tyson Alias, MD

## 2022-03-20 ENCOUNTER — Ambulatory Visit: Payer: Commercial Managed Care - HMO | Admitting: Podiatry

## 2022-03-20 ENCOUNTER — Ambulatory Visit (INDEPENDENT_AMBULATORY_CARE_PROVIDER_SITE_OTHER): Payer: Commercial Managed Care - HMO

## 2022-03-20 ENCOUNTER — Encounter: Payer: Self-pay | Admitting: Podiatry

## 2022-03-20 DIAGNOSIS — Z9889 Other specified postprocedural states: Secondary | ICD-10-CM | POA: Diagnosis not present

## 2022-03-20 DIAGNOSIS — M205X2 Other deformities of toe(s) (acquired), left foot: Secondary | ICD-10-CM | POA: Diagnosis not present

## 2022-03-20 DIAGNOSIS — M779 Enthesopathy, unspecified: Secondary | ICD-10-CM

## 2022-03-20 DIAGNOSIS — M722 Plantar fascial fibromatosis: Secondary | ICD-10-CM | POA: Diagnosis not present

## 2022-03-20 MED ORDER — DEXAMETHASONE SODIUM PHOSPHATE 120 MG/30ML IJ SOLN: 4.0000 mg | Freq: Once | INTRAMUSCULAR | Status: DC
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
## 2022-03-21 ENCOUNTER — Telehealth: Payer: Self-pay | Admitting: Neurology

## 2022-03-21 DIAGNOSIS — G4733 Obstructive sleep apnea (adult) (pediatric): Secondary | ICD-10-CM

## 2022-03-21 NOTE — Telephone Encounter (Signed)
New insurance information: [employer] Member ID: [id number] Group# [id number] BIN# [id number] PCN# [id number]

## 2022-03-22 NOTE — Telephone Encounter (Signed)
Spoke with patient . Patient chose [facility] for DME per Dr Frances Furbish  sent orders over to [facility] this afternoon . Made initial CPAP follow up appointment for 05/30/2022 patient is aware to bring CPAP machine and powercord.for appointment Informed patient he has to wear CPAP 4 hours + nightly to remain compliant so insurance will help pay for  CPAP machine and supplies Pt expressed understanding

## 2022-03-22 NOTE — Telephone Encounter (Signed)
AutoPap order placed.  Please process and set up follow-up appointment according to set up date.  May schedule with nurse practitioner.

## 2022-03-26 ENCOUNTER — Telehealth: Payer: Self-pay | Admitting: *Deleted

## 2022-03-26 NOTE — Telephone Encounter (Signed)
LVM to make patient aware his insurance wants him to have another sleep study done since last one was in 2020 to issue a new CPAP machine . Will Forward to Dr Frances Furbish to make her aware

## 2022-03-26 NOTE — Telephone Encounter (Signed)
This is in Dr Teofilo Pod box to address when she returns to the office.

## 2022-03-26 NOTE — Telephone Encounter (Signed)
Received fax from [facility] that for pt to re establish care he will need to complete a diagnositic study or titration since the sleep study done was from 2020.Marland Kitchen

## 2022-03-26 NOTE — Telephone Encounter (Signed)
Patient left a voicemail with our office this afternoon to schedule a sleep study.  It does not appear that a sleep study has been ordered yet.

## 2022-03-28 NOTE — Telephone Encounter (Signed)
HST ordered.

## 2022-03-28 NOTE — Addendum Note (Signed)
Addended by: Huston Foley on: 03/28/2022 11:50 AM   Modules accepted: Orders

## 2022-03-28 NOTE — Telephone Encounter (Signed)
Updated faxed to [facility]. Received a receipt of confirmation. Patient has also been updated via mychart.

## 2022-04-12 ENCOUNTER — Ambulatory Visit: Payer: Medicaid Other | Admitting: Behavioral Health

## 2022-04-12 DIAGNOSIS — F419 Anxiety disorder, unspecified: Secondary | ICD-10-CM

## 2022-04-12 DIAGNOSIS — F331 Major depressive disorder, recurrent, moderate: Secondary | ICD-10-CM

## 2022-04-12 NOTE — BH Specialist Note (Signed)
Integrated Behavioral Health via Telemedicine Visit  04/12/2022 Paul Jarvis [id number]  Number of Integrated Behavioral Health Clinician visits: 12 Session Start time: 1115 Session End time: 1145 Total time in minutes: 30 min  Referring Provider: [name], [facility] Patient/Family location: Pt is home in private [facility] Provider location: [facility] Office All persons participating in visit: Pt & Clinician Types of Service: Individual psychotherapy  I connected with [name] and/or [name]  self  via  Telephone or Video Enabled Telemedicine Application  (Video is Caregility application) and verified that I am speaking with the correct person using two identifiers. Discussed confidentiality: Yes   I discussed the limitations of telemedicine and the availability of in person appointments.  Discussed there is a possibility of technology failure and discussed alternative modes of communication if that failure occurs.  I discussed that engaging in this telemedicine visit, they consent to the provision of behavioral healthcare and the services will be billed under their insurance.  Patient and/or legal guardian expressed understanding and consented to Telemedicine visit: Yes   Presenting Concerns: Patient and/or family reports the following symptoms/concerns: reduction in anx/dep since exploring [employer] for Therapy Resource List of Providers. Pt is also due for an updated Sleep Study Duration of problem: years; Severity of problem: mild  Patient and/or Family's Strengths/Protective Factors: Social connections, Social and Emotional competence, Concrete supports in place (healthy food, safe environments, etc.), and Sense of purpose  Goals Addressed: Patient will:  Reduce symptoms of: anxiety, depression, and stress   Increase knowledge and/or ability of: coping skills, healthy habits, and stress reduction   Demonstrate ability to: Increase healthy adjustment to current life  circumstances  Progress towards Goals: Revised; Pt is actively seeking a New Provider that will treat his Hx of trauma & take his Ins. Pt is on Disability since Oct 2022 & the adjustment has been challenging.   Interventions: Interventions utilized:  Supportive Counseling Standardized Assessments completed:  screeners prn  Patient and/or Family Response: Pt is receptive to call today.  Assessment: Patient currently experiencing reduction in anx/dep since he has begun to feel better.   Patient may benefit from cont'd Therapy using TIC for wkly sessions.  Plan: Follow up with behavioral health clinician on : TBD by Pt Behavioral recommendations: Care for self & cont therapy Referral(s): Counselor  I discussed the assessment and treatment plan with the patient and/or parent/guardian. They were provided an opportunity to ask questions and all were answered. They agreed with the plan and demonstrated an understanding of the instructions.   They were advised to call back or seek an in-person evaluation if the symptoms worsen or if the condition fails to improve as anticipated.  Deneise Lever, LMFT

## 2022-04-26 ENCOUNTER — Telehealth: Payer: Self-pay | Admitting: Neurology

## 2022-04-26 NOTE — Telephone Encounter (Signed)
HST- CIgna no auth req spoke to Paul Jarvis ref # [id number]. Patient is scheduled at [facility] for 05/22/22 at 3:30 pm.   I mailed packet to the patient.

## 2022-05-22 ENCOUNTER — Ambulatory Visit: Payer: Commercial Managed Care - HMO | Admitting: Neurology

## 2022-05-22 DIAGNOSIS — G4733 Obstructive sleep apnea (adult) (pediatric): Secondary | ICD-10-CM

## 2022-05-24 NOTE — Progress Notes (Signed)
See procedure note.

## 2022-05-24 NOTE — Procedures (Signed)
   [facility]  HOME SLEEP TEST (Watch PAT) REPORT  STUDY DATE: 05/23/2022  DOB: [birth year]  MRN: [id number]  ORDERING CLINICIAN: Huston Foley, MD, PhD   CLINICAL INFORMATION/HISTORY: 31 year old right-handed gentleman with an underlying medical history of anxiety, depression, migraine headaches, right upper extremity paresthesias, and obesity, who presents for evaluation of his obstructive sleep apnea.   Epworth sleepiness score: 9/24.  BMI: 34.3 kg/m  FINDINGS:   Sleep Summary:   Total Recording Time (hours, min): 7 hours, 35 min  Total Sleep Time (hours, min):  6 hours, 2 min  Percent REM (%):    21.2%   Respiratory Indices:   Calculated pAHI (per hour):  23.3/hour         REM pAHI:    52.5/hour       NREM pAHI: 21.8/hour  Central pAHI: 0.7/hour  Oxygen Saturation Statistics:    Oxygen Saturation (%) Mean: 95%   Minimum oxygen saturation (%):                 81%   O2 Saturation Range (%): 81-100%    O2 Saturation (minutes) <=88%: 0.2 min  Pulse Rate Statistics:   Pulse Mean (bpm):    64/min    Pulse Range (48-101/min)   IMPRESSION: OSA (obstructive sleep apnea)   RECOMMENDATION:  This home sleep test demonstrates moderate obstructive sleep apnea with a total AHI of 23.3/hour and O2 nadir of 81%.  Intermittent mild to moderate snoring was detected, at times consistently in the loud range. Treatment with a positive airway pressure (PAP) device is recommended. The patient will be advised to proceed with an autoPAP titration/trial at home for now. A full night titration study may be considered to optimize treatment settings, if needed down the road.  Alternative treatment options may include a dental device through dentistry or orthodontics in selected patients or Inspire (hypoglossal nerve stimulator) in carefully selected patients (meeting inclusion criteria).  Concomitant weight loss is recommended (where clinically appropriate). Please  note, that untreated obstructive sleep apnea may carry additional perioperative morbidity. Patients with significant obstructive sleep apnea should receive perioperative PAP therapy and the surgeons and particularly the anesthesiologist should be informed of the diagnosis and the severity of the sleep disordered breathing. The patient should be cautioned not to drive, work at heights, or operate dangerous or heavy equipment when tired or sleepy. Review and reiteration of good sleep hygiene measures should be pursued with any patient. Other causes of the patient's symptoms, including circadian rhythm disturbances, an underlying mood disorder, medication effect and/or an underlying medical problem cannot be ruled out based on this test. Clinical correlation is recommended. The patient and his referring provider will be notified of the test results. The patient will be seen in follow up in sleep clinic at [facility].  I certify that I have reviewed the raw data recording prior to the issuance of this report in accordance with the standards of the American Academy of Sleep Medicine (AASM).  INTERPRETING PHYSICIAN:   Huston Foley, MD, PhD  Board Certified in Neurology and Sleep Medicine  [facility] [address] [place], [place] [address] [phone]

## 2022-05-24 NOTE — Addendum Note (Signed)
Addended by: Huston Foley on: 05/24/2022 06:16 PM   Modules accepted: Orders

## 2022-05-29 ENCOUNTER — Telehealth: Payer: Self-pay | Admitting: *Deleted

## 2022-05-29 NOTE — Telephone Encounter (Signed)
-----   Message from Huston Foley, MD sent at 05/24/2022  6:16 PM EDT ----- Patient was originally referred by Dr. Marjory Lies, he recently had a home sleep test for reevaluation of his sleep apnea.  Please advise patient that his HST from 05/23/2022 indicated moderate obstructive sleep apnea. I recommend treatment in the form of autoPAP, which means, that we don't have to bring him in for a sleep study with CPAP, but will let him start using a so called autoPAP machine at home, which is a CPAP-like machine with self-adjusting pressures. We will send the order to a local DME company (of his choice, or as per insurance requirement). The DME representative will fit him with a mask, educate him on how to use the machine, how to put the mask on, etc. I have placed an order in the chart. Please send the order, talk to patient, send report to referring MD. We will need a FU in sleep clinic for 10 weeks post-PAP set up, please arrange that with me or one of our NPs. Also reinforce the need for compliance with treatment. Thanks,   Huston Foley, MD, PhD [facility])

## 2022-05-29 NOTE — Telephone Encounter (Signed)
Spoke with patient gave sleep study results. Informed patient of compliance for his insurance  to help pay for autoPAP machine and supplies . Pt is aware to wear AutoPAP 4 hours nightly and keep all f/u appointments Pt chose [facility] for DME . Made initial f/u appointment on 07/2022 Pt expressed understanding and thanked me for calling

## 2022-05-30 ENCOUNTER — Ambulatory Visit: Payer: Commercial Managed Care - HMO | Admitting: Adult Health

## 2022-06-04 ENCOUNTER — Telehealth: Payer: Self-pay | Admitting: *Deleted

## 2022-06-04 NOTE — Telephone Encounter (Signed)
[facility] faxed most recent HST.  Received fax confirmation. [phone].

## 2022-07-23 ENCOUNTER — Encounter: Payer: Commercial Managed Care - HMO | Admitting: Adult Health

## 2022-08-01 ENCOUNTER — Ambulatory Visit (INDEPENDENT_AMBULATORY_CARE_PROVIDER_SITE_OTHER): Payer: Commercial Managed Care - HMO | Admitting: Student in an Organized Health Care Education/Training Program

## 2022-08-01 ENCOUNTER — Encounter: Payer: Self-pay | Admitting: Student in an Organized Health Care Education/Training Program

## 2022-08-01 VITALS — BP 171/116 | HR 85 | Temp 98.4°F | Ht 69.0 in | Wt 253.6 lb

## 2022-08-01 DIAGNOSIS — I10 Essential (primary) hypertension: Secondary | ICD-10-CM

## 2022-08-01 DIAGNOSIS — Z3009 Encounter for other general counseling and advice on contraception: Secondary | ICD-10-CM | POA: Diagnosis not present

## 2022-08-01 DIAGNOSIS — M797 Fibromyalgia: Secondary | ICD-10-CM | POA: Diagnosis not present

## 2022-08-01 DIAGNOSIS — R109 Unspecified abdominal pain: Secondary | ICD-10-CM | POA: Diagnosis not present

## 2022-08-01 MED ORDER — CHLORTHALIDONE 25 MG PO TABS: 25.0000 mg | ORAL_TABLET | Freq: Every day | ORAL | 3 refills | Status: DC

## 2022-08-01 NOTE — Progress Notes (Deleted)
Urology referral?  03/19/2022 Baby due in oct Hypertension-amlodipine/valsartan 10/320 mg daily Fibromyalgia-duloxetine 60 mg daily, meloxicam 15 mg daily Plantar fascia-podiatry 03/20/2022 steroid injection of first MTP joint OSA- sleep stufy on 05/22/2022 rec CPAP Labs from 12/25/2021-A1c 5.0, hep C negative, TSH normal, total cholesterol 243, HDL 31, LDL 135, creatinine 1.37 with EGFR of 70 ASCVD risk calculation of 3.3%, no statin recommended  Flu vaccine

## 2022-08-01 NOTE — Assessment & Plan Note (Signed)
Blood pressure uncontrolled today, likely related to recent weight gain and increase in stress at home. Good adherence with current combo med, will continue amlodipine-valsartan 10-320mg . Will add chlorthalidone 25mg  daily. Follow up in 4 weeks for recheck BMP.

## 2022-08-01 NOTE — Assessment & Plan Note (Signed)
Patient just welcomed fourth child. He is ready for permanent birth control option and desires vasectomy. I will refer him to urology.

## 2022-08-01 NOTE — Progress Notes (Signed)
   Established Patient Office Visit  Subjective   Patient ID: Jacoby Zanni, male    DOB: March 25, 1991  Age: 31 y.o. MRN: [id number]  Chief Complaint  Patient presents with   Groin Pain   Abdominal Pain   Diarrhea    HPI  31 year old person here for acute visit for abdominal discomfort. He recently welcomed a fourth child, delivery went well and his wife is recovering nicely. This has led to more stress at home. His arthralgia symptoms are improved, doing well on cymbalta. Now having a sensation of left upper and lower abdominal discomfort. Reports having around 2-3 loose bowel movements daily, but has a history of both constipation and diarrhea. He has some upper epigastric bloating, and feels he has some lower pelvic discomfort with bowel movements. No nausea or vomiting, no hematochezia, good appetite, no early satiety, he is gaining weight recently.     Objective:     BP (!) 171/116 (BP Location: Right Arm, Patient Position: Sitting, Cuff Size: Normal)   Pulse 85   Temp 98.4 F (36.9 C)   Ht 5\' 9"  (1.753 m)   Wt 253 lb 9.6 oz (115 kg)   SpO2 100%   BMI 37.45 kg/m    Physical Exam  Gen: well appearing, no distress Abd: soft, obese, non tender, no distention, no organomegaly Ext: warm, no edema, no synovitis  Psych: normal affect, not depressed or anxious appearing Neuro: oriented and conversational, normal strength throughout, normal gait    Assessment & Plan:   Problem List Items Addressed This Visit       High   Hypertension (Chronic)    Blood pressure uncontrolled today, likely related to recent weight gain and increase in stress at home. Good adherence with current combo med, will continue amlodipine-valsartan 10-320mg . Will add chlorthalidone 25mg  daily. Follow up in 4 weeks for recheck BMP.      Relevant Medications   chlorthalidone (HYGROTON) 25 MG tablet   Fibromyalgia syndrome (Chronic)    Arthralgias are stable at this time, not function limiting. Plan to  continue with duloxetine 60mg  daily.         Unprioritized   Abdominal discomfort - Primary    Intermittent issue, but worsening lately. Based on the symptoms and history of fibromyalgia, I suspect this is a mixed type of IBS. He has no red flag symptoms for IBD. His exam is reassuring with no high risk features. Will rule out celiac disease, check cbc, cmp, and crp. If those are normal, will start dicyclomine as his symptoms seem mostly related to spasticity and visceral hypersensitivity.       Relevant Orders   CBC no Diff   CRP (C-Reactive Protein)   Tissue Transglutaminase Abs,IgG,IgA   IgA   CMP14 + Anion Gap   Family planning    Patient just welcomed fourth child. He is ready for permanent birth control option and desires vasectomy. I will refer him to urology.      Relevant Orders   Ambulatory referral to Urology    Return in about 4 weeks (around 08/29/2022).    Axel Filler, MD

## 2022-08-01 NOTE — Assessment & Plan Note (Signed)
Intermittent issue, but worsening lately. Based on the symptoms and history of fibromyalgia, I suspect this is a mixed type of IBS. He has no red flag symptoms for IBD. His exam is reassuring with no high risk features. Will rule out celiac disease, check cbc, cmp, and crp. If those are normal, will start dicyclomine as his symptoms seem mostly related to spasticity and visceral hypersensitivity.

## 2022-08-01 NOTE — Assessment & Plan Note (Signed)
Arthralgias are stable at this time, not function limiting. Plan to continue with duloxetine 60mg  daily.

## 2022-08-03 LAB — IGA: Immunoglobulin A, (IgA) QN, Serum: 55 mg/dL — ABNORMAL LOW (ref 90–386)

## 2022-08-03 LAB — CMP14 + ANION GAP
ALT: 20 IU/L (ref 0–44)
AST: 19 IU/L (ref 0–40)
Albumin/Globulin Ratio: 2 (ref 1.2–2.2)
Albumin: 4.7 g/dL (ref 4.1–5.1)
Alkaline Phosphatase: 78 IU/L (ref 44–121)
Anion Gap: 15 mmol/L (ref 10.0–18.0)
BUN/Creatinine Ratio: 11 (ref 9–20)
BUN: 13 mg/dL (ref 6–20)
Bilirubin Total: <0.2 mg/dL (ref 0.0–1.2)
CO2: 23 mmol/L (ref 20–29)
Calcium: 9.6 mg/dL (ref 8.7–10.2)
Chloride: 103 mmol/L (ref 96–106)
Creatinine, Ser: 1.2 mg/dL (ref 0.76–1.27)
Globulin, Total: 2.4 g/dL (ref 1.5–4.5)
Glucose: 105 mg/dL — ABNORMAL HIGH (ref 70–99)
Potassium: 3.9 mmol/L (ref 3.5–5.2)
Sodium: 141 mmol/L (ref 134–144)
Total Protein: 7.1 g/dL (ref 6.0–8.5)
eGFR: 82 mL/min/1.73

## 2022-08-03 LAB — CBC
Hematocrit: 43.3 % (ref 37.5–51.0)
Hemoglobin: 15 g/dL (ref 13.0–17.7)
MCH: 28.6 pg (ref 26.6–33.0)
MCHC: 34.6 g/dL (ref 31.5–35.7)
MCV: 83 fL (ref 79–97)
Platelets: 309 x10E3/uL (ref 150–450)
RBC: 5.25 x10E6/uL (ref 4.14–5.80)
RDW: 14.1 % (ref 11.6–15.4)
WBC: 5.4 x10E3/uL (ref 3.4–10.8)

## 2022-08-03 LAB — TISSUE TRANSGLUTAMINASE ABS,IGG,IGA
Tissue Transglut Ab: 4 U/mL (ref 0–5)
Transglutaminase IgA: <2 U/mL (ref 0–3)

## 2022-08-03 LAB — C-REACTIVE PROTEIN: CRP: 4 mg/L (ref 0–10)

## 2022-08-03 MED ORDER — DICYCLOMINE HCL 20 MG PO TABS: 20.0000 mg | ORAL_TABLET | Freq: Three times a day (TID) | ORAL | 2 refills | Status: DC | PRN

## 2022-08-03 NOTE — Addendum Note (Signed)
Addended by: Lalla Brothers T on: 08/03/2022 08:28 AM   Modules accepted: Orders

## 2022-08-18 ENCOUNTER — Other Ambulatory Visit: Payer: Self-pay

## 2022-08-18 ENCOUNTER — Emergency Department (HOSPITAL_COMMUNITY)
Admission: EM | Admit: 2022-08-18 | Discharge: 2022-08-18 | Disposition: A | Discharge status: Home / Self Care | Payer: Commercial Managed Care - HMO | Attending: Emergency Medicine | Admitting: Emergency Medicine

## 2022-08-18 ENCOUNTER — Encounter (HOSPITAL_COMMUNITY): Payer: Self-pay | Admitting: *Deleted

## 2022-08-18 DIAGNOSIS — Z79899 Other long term (current) drug therapy: Secondary | ICD-10-CM | POA: Diagnosis not present

## 2022-08-18 DIAGNOSIS — E876 Hypokalemia: Secondary | ICD-10-CM | POA: Diagnosis not present

## 2022-08-18 DIAGNOSIS — L0501 Pilonidal cyst with abscess: Secondary | ICD-10-CM

## 2022-08-18 DIAGNOSIS — R030 Elevated blood-pressure reading, without diagnosis of hypertension: Secondary | ICD-10-CM

## 2022-08-18 DIAGNOSIS — I10 Essential (primary) hypertension: Secondary | ICD-10-CM | POA: Diagnosis not present

## 2022-08-18 LAB — BASIC METABOLIC PANEL
Anion gap: 11 (ref 5–15)
BUN: 13 mg/dL (ref 6–20)
CO2: 26 mmol/L (ref 22–32)
Calcium: 9.8 mg/dL (ref 8.9–10.3)
Chloride: 101 mmol/L (ref 98–111)
Creatinine, Ser: 1.42 mg/dL — ABNORMAL HIGH (ref 0.61–1.24)
GFR, Estimated: >60 mL/min (ref >60)
Glucose, Bld: 132 mg/dL — ABNORMAL HIGH (ref 70–99)
Potassium: 2.8 mmol/L — ABNORMAL LOW (ref 3.5–5.1)
Sodium: 138 mmol/L (ref 135–145)

## 2022-08-18 LAB — CBC WITH DIFFERENTIAL/PLATELET
Abs Immature Granulocytes: 0.02 10*3/uL (ref 0.00–0.07)
Basophils Absolute: 0 10*3/uL (ref 0.0–0.1)
Basophils Relative: 0 %
Eosinophils Absolute: 0.2 10*3/uL (ref 0.0–0.5)
Eosinophils Relative: 2 %
HCT: 40.4 % (ref 39.0–52.0)
Hemoglobin: 14 g/dL (ref 13.0–17.0)
Immature Granulocytes: 0 %
Lymphocytes Relative: 16 %
Lymphs Abs: 1.8 10*3/uL (ref 0.7–4.0)
MCH: 28 pg (ref 26.0–34.0)
MCHC: 34.7 g/dL (ref 30.0–36.0)
MCV: 80.8 fL (ref 80.0–100.0)
Monocytes Absolute: 0.8 10*3/uL (ref 0.1–1.0)
Monocytes Relative: 7 %
Neutro Abs: 8.2 10*3/uL — ABNORMAL HIGH (ref 1.7–7.7)
Neutrophils Relative %: 75 %
Platelets: 303 10*3/uL (ref 150–400)
RBC: 5 MIL/uL (ref 4.22–5.81)
RDW: 13.7 % (ref 11.5–15.5)
WBC: 11 10*3/uL — ABNORMAL HIGH (ref 4.0–10.5)
nRBC: 0 % (ref 0.0–0.2)

## 2022-08-18 LAB — LACTIC ACID, PLASMA: Lactic Acid, Venous: 1.5 mmol/L (ref 0.5–1.9)

## 2022-08-18 MED ORDER — DOXYCYCLINE HYCLATE 100 MG PO CAPS: 100.0000 mg | ORAL_CAPSULE | Freq: Two times a day (BID) | ORAL | 0 refills | Status: DC

## 2022-08-18 MED ORDER — POTASSIUM CHLORIDE CRYS ER 20 MEQ PO TBCR
40.0000 meq | EXTENDED_RELEASE_TABLET | Freq: Once | ORAL | Status: AC
  Administered 2022-08-18: 40 meq via ORAL
  Filled 2022-08-18: qty 2

## 2022-08-18 MED ORDER — HYDROCODONE-ACETAMINOPHEN 5-325 MG PO TABS: 1.0000 | ORAL_TABLET | Freq: Four times a day (QID) | ORAL | 0 refills | Status: DC | PRN

## 2022-08-18 MED ORDER — MORPHINE SULFATE (PF) 4 MG/ML IV SOLN
4.0000 mg | Freq: Once | INTRAVENOUS | Status: AC
  Administered 2022-08-18: 4 mg via INTRAVENOUS
  Filled 2022-08-18: qty 1

## 2022-08-18 MED ORDER — OXYCODONE-ACETAMINOPHEN 5-325 MG PO TABS
1.0000 | ORAL_TABLET | Freq: Once | ORAL | Status: AC
  Administered 2022-08-18: 1 via ORAL
  Filled 2022-08-18: qty 1

## 2022-08-18 MED ORDER — LIDOCAINE HCL (PF) 1 % IJ SOLN
10.0000 mL | Freq: Once | INTRAMUSCULAR | Status: AC
  Administered 2022-08-18: 10 mL
  Filled 2022-08-18: qty 10

## 2022-08-18 MED ORDER — POTASSIUM CHLORIDE 10 MEQ/100ML IV SOLN
10.0000 meq | INTRAVENOUS | Status: AC
  Administered 2022-08-18 (×2): 10 meq via INTRAVENOUS
  Filled 2022-08-18 (×2): qty 100

## 2022-08-18 MED ORDER — SODIUM CHLORIDE 0.9 % IV BOLUS
1000.0000 mL | Freq: Once | INTRAVENOUS | Status: AC
  Administered 2022-08-18: 1000 mL via INTRAVENOUS

## 2022-08-18 NOTE — ED Provider Notes (Signed)
[facility] EM[facility]GENCY DEPARTMENT Provider Note   CSN: [id number] Arrival date & time: 08/18/22  0024     History  Chief Complaint  Patient presents with   Abscess    Paul Jarvis is a 31 y.o. male with a history of bipolar disorder, hypertension, hyperlipidemia, and fibromyalgia who presents to the ED due to left upper buttocks abscess.  Patient states area started on Monday and has progressively gotten worse.  No fever or chills.  Patient states pain worse with sitting.  No difficulties with bowel movements.  Denies urinary symptoms.  History obtained from patient and past medical records. No interpreter used during encounter.       Home Medications Prior to Admission medications   Medication Sig Start Date End Date Taking? Authorizing Provider  doxycycline (VIBRAMYCIN) 100 MG capsule Take 1 capsule (100 mg total) by mouth 2 (two) times daily. 08/18/22  Yes Nakyah Erdmann, Druscilla Brownie, PA-C  doxycycline (VIBRAMYCIN) 100 MG capsule Take 1 capsule (100 mg total) by mouth 2 (two) times daily. 08/18/22  Yes Dorsey Charette, Druscilla Brownie, PA-C  HYDROcodone-acetaminophen (NORCO/VICODIN) 5-325 MG tablet Take 1 tablet by mouth every 6 (six) hours as needed for severe pain. 08/18/22  Yes Dream Nodal C, PA-C  amLODipine-valsartan (EXFORGE) 10-320 MG tablet Take 1 tablet by mouth daily. 03/19/22   Axel Filler, MD  chlorthalidone (HYGROTON) 25 MG tablet Take 1 tablet (25 mg total) by mouth daily. 08/01/22   Axel Filler, MD  dicyclomine (BENTYL) 20 MG tablet Take 1 tablet (20 mg total) by mouth 3 (three) times daily as needed (Abdominal discomfort). 08/03/22   Axel Filler, MD  DULoxetine (CYMBALTA) 60 MG capsule Take 1 capsule (60 mg total) by mouth daily. 03/19/22   Axel Filler, MD  Meloxicam 15 MG TBDP Take 15 mg by mouth daily as needed. 03/19/22   Axel Filler, MD      Allergies    Patient has no known allergies.    Review of Systems    Review of Systems  Constitutional:  Negative for chills and fever.  Skin:  Positive for color change and wound.    Physical Exam Updated Vital Signs BP (!) 180/119   Pulse 94   Temp 98 F (36.7 C)   Resp 18   Ht 5\' 9"  (1.753 m)   Wt 116.6 kg   SpO2 100%   BMI 37.95 kg/m  Physical Exam Vitals and nursing note reviewed.  Constitutional:      General: He is not in acute distress.    Appearance: He is not ill-appearing.  HENT:     Head: Normocephalic.  Eyes:     Pupils: Pupils are equal, round, and reactive to light.  Cardiovascular:     Rate and Rhythm: Normal rate and regular rhythm.     Pulses: Normal pulses.     Heart sounds: Normal heart sounds. No murmur heard.    No friction rub. No gallop.  Pulmonary:     Effort: Pulmonary effort is normal.     Breath sounds: Normal breath sounds.  Abdominal:     General: Abdomen is flat. There is no distension.     Palpations: Abdomen is soft.     Tenderness: There is no abdominal tenderness. There is no guarding or rebound.  Musculoskeletal:        General: Normal range of motion.     Cervical back: Neck supple.  Skin:    General: Skin is warm and dry.  Comments: 3 x 2 cm area of fluctuance with surrounding induration on upper left buttocks to gluteal cleft. Induration does not tract to rectum.   Neurological:     General: No focal deficit present.     Mental Status: He is alert.  Psychiatric:        Mood and Affect: Mood normal.        Behavior: Behavior normal.     ED Results / Procedures / Treatments   Labs (all labs ordered are listed, but only abnormal results are displayed) Labs Reviewed  CBC WITH DIFFERENTIAL/PLATELET - Abnormal; Notable for the following components:      Result Value   WBC 11.0 (*)    Neutro Abs 8.2 (*)    All other components within normal limits  BASIC METABOLIC PANEL - Abnormal; Notable for the following components:   Potassium 2.8 (*)    Glucose, Bld 132 (*)    Creatinine, Ser  1.42 (*)    All other components within normal limits  LACTIC ACID, PLASMA  LACTIC ACID, PLASMA    EKG None  Radiology No results found.  Procedures .Marland KitchenIncision and Drainage  Date/Time: 08/18/2022 8:50 AM  Performed by: Mannie Stabile, PA-C Authorized by: Mannie Stabile, PA-C   Consent:    Consent obtained:  Verbal   Consent given by:  Patient   Risks discussed:  Bleeding, incomplete drainage, pain and damage to other organs   Alternatives discussed:  No treatment Universal protocol:    Procedure explained and questions answered to patient or proxy's satisfaction: yes     Relevant documents present and verified: yes     Test results available : yes     Imaging studies available: yes     Required blood products, implants, devices, and special equipment available: yes     Site/side marked: yes     Immediately prior to procedure, a time out was called: yes     Patient identity confirmed:  Verbally with patient Location:    Type:  Abscess   Size:  2x3   Location:  Anogenital   Anogenital location:  Pilonidal Pre-procedure details:    Skin preparation:  Betadine Anesthesia:    Anesthesia method:  Local infiltration   Local anesthetic:  Lidocaine 1% WITH epi Procedure type:    Complexity:  Complex Procedure details:    Incision types:  Single straight   Incision depth:  Subcutaneous   Wound management:  Probed and deloculated, irrigated with saline and extensive cleaning   Drainage:  Purulent   Drainage amount:  Moderate   Packing materials:  1/2 in gauze Post-procedure details:    Procedure completion:  Tolerated well, no immediate complications     Medications Ordered in ED Medications  oxyCODONE-acetaminophen (PERCOCET/ROXICET) 5-325 MG per tablet 1 tablet (1 tablet Oral Given 08/18/22 0225)  sodium chloride 0.9 % bolus 1,000 mL (0 mLs Intravenous Stopped 08/18/22 0925)  potassium chloride SA (KLOR-CON M) CR tablet 40 mEq (40 mEq Oral Given 08/18/22  0821)  potassium chloride 10 mEq in 100 mL IVPB (10 mEq Intravenous New Bag/Given 08/18/22 0933)  lidocaine (PF) (XYLOCAINE) 1 % injection 10 mL (10 mLs Infiltration Given 08/18/22 0821)  morphine (PF) 4 MG/ML injection 4 mg (4 mg Intravenous Given 08/18/22 0835)  morphine (PF) 4 MG/ML injection 4 mg (4 mg Intravenous Given 08/18/22 0934)    ED Course/ Medical Decision Making/ A&P Clinical Course as of 08/18/22 1034  Sat Aug 18, 2022  0719 Potassium(!): 2.8 [  CA]  0719 Glucose(!): 132 [CA]  0719 Creatinine(!): 1.42 [CA]  0719 WBC(!): 11.0 [CA]    Clinical Course User Index [CA] Mannie Stabile, PA-C                           Medical Decision Making Amount and/or Complexity of Data Reviewed Labs: ordered. Decision-making details documented in ED Course.  Risk Prescription drug management.   31 year old male presents to the ED due to abscess to left buttocks consistent with pilonidal abscess.  No fever or chills.  No evidence of sepsis.  Routine labs ordered at triage.  Mild leukocytosis at 11.  BMP significant for hypokalemia at 2.8 and elevated creatinine 1.42.  Patient given IV fluids and oral and IV potassium here in the ED.  Lactic acid normal at 1.5.  Upon arrival, patient tachycardic 127 which patient relates to pain however, heart rate improved after pain medication.  I&D performed at bedside as noted above with help from Dr. Anitra Lauth.  Patient admits to improvement in pain after I&D.  Patient discharged with antibiotics and pain medication.  Advised patient to have packing removed in 2 to 3 days if it does not fall out on its own. Instructed patient to have potassium rechecked in 1 week.  Patient's blood pressure elevated.  Patient notes he did not take his blood pressure medications prior to arrival.  Patient took his home medications at bedside.  Denies headache, blurry vision, or chest pain.  Low suspicion for hypertensive urgency/emergency.  Advised patient to have BP rechecked  in the next few days.  Strict ED precautions discussed with patient. Patient states understanding and agrees to plan. Patient discharged home in no acute distress and stable vitals.  Discussed with Dr. Anitra Lauth who evaluated patient at bedside and agrees with assessment and plan.        Final Clinical Impression(s) / ED Diagnoses Final diagnoses:  Pilonidal abscess  Hypokalemia  Elevated blood pressure reading    Rx / DC Orders ED Discharge Orders          Ordered    doxycycline (VIBRAMYCIN) 100 MG capsule  2 times daily        08/18/22 0853    doxycycline (VIBRAMYCIN) 100 MG capsule  2 times daily        08/18/22 0854    HYDROcodone-acetaminophen (NORCO/VICODIN) 5-325 MG tablet  Every 6 hours PRN        08/18/22 0854              Mannie Stabile, PA-C 08/18/22 1106    Gwyneth Sprout, MD 08/18/22 1152

## 2022-08-18 NOTE — ED Notes (Signed)
Patient educated about not driving or performing other critical tasks (such as operating heavy machinery, caring for infant/toddler/child) due to sedative nature of narcotic medications received while in the ED.  Pt/caregiver verbalized understanding.   

## 2022-08-18 NOTE — ED Triage Notes (Signed)
Pt reporting abscess to the left upper buttocks that he noticed around Monday, making sitting difficult. He denies drainage or fevers.

## 2022-08-18 NOTE — ED Notes (Signed)
DC instructions reviewed with pt. PT verbalized understanding. PT DC °

## 2022-08-18 NOTE — Discharge Instructions (Addendum)
It was a pleasure taking care of you today.  As discussed, your abscess was drained here in the ER.  I am sending him with antibiotics.  Take as prescribed and finish all antibiotics.  I am also sending him with a short course of pain medication.  Only take for severe pain.  Medication can cause drowsiness do not drive or operate machinery while the medication.  Return to the ER for new or worsening symptoms.  Your potassium was low today.  Please have your PCP recheck your potassium in 1 week.  Your blood pressure was elevated.  Continue taking your blood pressure medications as prescribed.  Have BP rechecked in the next few days.  Packing may fall out on its own.  If it does not fall please remove in 2 to 3 days.

## 2022-08-18 NOTE — ED Provider Triage Note (Signed)
Emergency Medicine Provider Triage Evaluation Note  Paul Jarvis , a 31 y.o. male  was evaluated in triage.  Pt complains of pain and swelling in his left buttock and gluteal cleft in his back x1 week.  Initially with some discomfort now feels like is getting tighter and exquisitely more painful, difficult to sit.  Denies fevers chills nausea vomiting diarrhea.  Patient diaphoretic time my evaluation, states it is very warm in that room, has taken his sweater off.  Patient does appear uncomfortable, taking carbamazepine at home which he states that been treating his pain well..  Review of Systems  Positive: As above Negative: Anal pain anal bleeding  Physical Exam  BP (!) 175/105 (BP Location: Right Arm)   Pulse (!) 119   Temp 98.6 F (37 C) (Oral)   Resp 16   Ht 5\' 9"  (1.753 m)   Wt 116.6 kg   SpO2 100%   BMI 37.95 kg/m  Gen:   Awake, visibly comfortable Resp:  Normal effort  MSK:   Moves extremities without difficulty  Other:  Patient leaned forward and scooted waistband down.  3 x 2 cm area of fluctuance with surrounding induration in the left buttock at the proximal gluteal cleft with erythema extending to left buttock.  No further exam/no GU exam performed in triage.  Medical Decision Making  Medically screening exam initiated at 1:57 AM.  Appropriate orders placed.  Paul Jarvis was informed that the remainder of the evaluation will be completed by another provider, this initial triage assessment does not replace that evaluation, and the importance of remaining in the ED until their evaluation is complete.  This chart was dictated using voice recognition software, Dragon. Despite the best efforts of this provider to proofread and correct errors, errors may still occur which can change documentation meaning.    Emeline Darling, PA-C 08/18/22 0205

## 2022-08-21 ENCOUNTER — Ambulatory Visit (INDEPENDENT_AMBULATORY_CARE_PROVIDER_SITE_OTHER): Payer: Commercial Managed Care - HMO | Admitting: Student

## 2022-08-21 ENCOUNTER — Ambulatory Visit (HOSPITAL_COMMUNITY)
Admit: 2022-08-21 | Discharge: 2022-08-21 | Disposition: A | Discharge status: Home / Self Care | Payer: Commercial Managed Care - HMO | Attending: Family Medicine | Admitting: Family Medicine

## 2022-08-21 ENCOUNTER — Ambulatory Visit (HOSPITAL_COMMUNITY): Payer: Self-pay | Attending: Family Medicine

## 2022-08-21 VITALS — BP 170/119 | HR 88 | Temp 98.0°F | Ht 69.0 in | Wt 242.2 lb

## 2022-08-21 DIAGNOSIS — E876 Hypokalemia: Secondary | ICD-10-CM | POA: Diagnosis not present

## 2022-08-21 DIAGNOSIS — R079 Chest pain, unspecified: Secondary | ICD-10-CM | POA: Diagnosis present

## 2022-08-21 DIAGNOSIS — I10 Essential (primary) hypertension: Secondary | ICD-10-CM

## 2022-08-21 DIAGNOSIS — L0591 Pilonidal cyst without abscess: Secondary | ICD-10-CM

## 2022-08-21 NOTE — Patient Instructions (Signed)
Pilonidal abscess Your inscision appears to be healing well Complete antibiotics and follow up if you are feeling worse or you develop, fever, drainage, redness, warmth, or bleeding  Blood pressure Please start taking amlodipine-valsartan in addition to chlorthalidone   Low potassium We will repeat you labs today and I will call with results  Chest pain You EKG appear to have improved, please discuss this further if you pain worsens for fails to improve

## 2022-08-22 ENCOUNTER — Emergency Department (HOSPITAL_COMMUNITY): Payer: Commercial Managed Care - HMO

## 2022-08-22 ENCOUNTER — Other Ambulatory Visit: Payer: Self-pay

## 2022-08-22 ENCOUNTER — Telehealth: Payer: Self-pay | Admitting: *Deleted

## 2022-08-22 ENCOUNTER — Emergency Department (HOSPITAL_COMMUNITY)
Admission: EM | Admit: 2022-08-22 | Discharge: 2022-08-22 | Disposition: A | Discharge status: Home / Self Care | Payer: Commercial Managed Care - HMO | Attending: Emergency Medicine | Admitting: Emergency Medicine

## 2022-08-22 DIAGNOSIS — I10 Essential (primary) hypertension: Secondary | ICD-10-CM | POA: Diagnosis not present

## 2022-08-22 DIAGNOSIS — R079 Chest pain, unspecified: Secondary | ICD-10-CM | POA: Diagnosis present

## 2022-08-22 DIAGNOSIS — E876 Hypokalemia: Secondary | ICD-10-CM

## 2022-08-22 DIAGNOSIS — R0789 Other chest pain: Secondary | ICD-10-CM | POA: Insufficient documentation

## 2022-08-22 DIAGNOSIS — Z79899 Other long term (current) drug therapy: Secondary | ICD-10-CM | POA: Insufficient documentation

## 2022-08-22 LAB — BMP8+ANION GAP
Anion Gap: 18 mmol/L (ref 10.0–18.0)
BUN/Creatinine Ratio: 13 (ref 9–20)
BUN: 18 mg/dL (ref 6–20)
CO2: 25 mmol/L (ref 20–29)
Calcium: 9.8 mg/dL (ref 8.7–10.2)
Chloride: 97 mmol/L (ref 96–106)
Creatinine, Ser: 1.34 mg/dL — ABNORMAL HIGH (ref 0.76–1.27)
Glucose: 117 mg/dL — ABNORMAL HIGH (ref 70–99)
Potassium: 3.1 mmol/L — ABNORMAL LOW (ref 3.5–5.2)
Sodium: 140 mmol/L (ref 134–144)
eGFR: 72 mL/min/{1.73_m2} (ref >59)

## 2022-08-22 LAB — CBC
HCT: 41.5 % (ref 39.0–52.0)
Hemoglobin: 14.2 g/dL (ref 13.0–17.0)
MCH: 27.4 pg (ref 26.0–34.0)
MCHC: 34.2 g/dL (ref 30.0–36.0)
MCV: 80.1 fL (ref 80.0–100.0)
Platelets: 387 10*3/uL (ref 150–400)
RBC: 5.18 MIL/uL (ref 4.22–5.81)
RDW: 13.6 % (ref 11.5–15.5)
WBC: 5 10*3/uL (ref 4.0–10.5)
nRBC: 0 % (ref 0.0–0.2)

## 2022-08-22 LAB — BASIC METABOLIC PANEL
Anion gap: 14 (ref 5–15)
Anion gap: 11 (ref 5–15)
BUN: 20 mg/dL (ref 6–20)
BUN: 17 mg/dL (ref 6–20)
CO2: 25 mmol/L (ref 22–32)
CO2: 26 mmol/L (ref 22–32)
Calcium: 9.5 mg/dL (ref 8.9–10.3)
Calcium: 9.5 mg/dL (ref 8.9–10.3)
Chloride: 98 mmol/L (ref 98–111)
Chloride: 101 mmol/L (ref 98–111)
Creatinine, Ser: 1.57 mg/dL — ABNORMAL HIGH (ref 0.61–1.24)
Creatinine, Ser: 1.44 mg/dL — ABNORMAL HIGH (ref 0.61–1.24)
GFR, Estimated: 59 mL/min — ABNORMAL LOW (ref >60)
GFR, Estimated: >60 mL/min (ref >60)
Glucose, Bld: 121 mg/dL — ABNORMAL HIGH (ref 70–99)
Glucose, Bld: 93 mg/dL (ref 70–99)
Potassium: 2.5 mmol/L — CL (ref 3.5–5.1)
Potassium: 3.3 mmol/L — ABNORMAL LOW (ref 3.5–5.1)
Sodium: 137 mmol/L (ref 135–145)
Sodium: 138 mmol/L (ref 135–145)

## 2022-08-22 LAB — TROPONIN I (HIGH SENSITIVITY)
Troponin I (High Sensitivity): 8 ng/L (ref <18)
Troponin I (High Sensitivity): 13 ng/L (ref <18)
Troponin I (High Sensitivity): 12 ng/L (ref <18)

## 2022-08-22 LAB — MAGNESIUM: Magnesium: 2.2 mg/dL (ref 1.7–2.4)

## 2022-08-22 MED ORDER — POTASSIUM CHLORIDE CRYS ER 20 MEQ PO TBCR
40.0000 meq | EXTENDED_RELEASE_TABLET | Freq: Once | ORAL | Status: AC
  Administered 2022-08-22: 40 meq via ORAL
  Filled 2022-08-22: qty 2

## 2022-08-22 MED ORDER — POTASSIUM CHLORIDE CRYS ER 20 MEQ PO TBCR: 20.0000 meq | EXTENDED_RELEASE_TABLET | Freq: Every day | ORAL | 0 refills | Status: DC

## 2022-08-22 MED ORDER — POTASSIUM CHLORIDE 10 MEQ/100ML IV SOLN
10.0000 meq | INTRAVENOUS | Status: AC
  Administered 2022-08-22 (×4): 10 meq via INTRAVENOUS
  Filled 2022-08-22 (×4): qty 100

## 2022-08-22 MED ORDER — SODIUM CHLORIDE 0.9 % IV BOLUS
1000.0000 mL | Freq: Once | INTRAVENOUS | Status: AC
  Administered 2022-08-22: 1000 mL via INTRAVENOUS

## 2022-08-22 NOTE — ED Notes (Addendum)
Patient discharge paperwork explained in depth with patient. Patient states understanding

## 2022-08-22 NOTE — ED Provider Triage Note (Signed)
Emergency Medicine Provider Triage Evaluation Note  Paul Jarvis , a 31 y.o. male  was evaluated in triage.  History includes hypertension fibromyalgia presented to the ER today for evaluation of chest pain that began this morning.  Pain is central constant does not radiate no clear aggravating alleviating factors.  Patient received 324 mg of aspirin by EMS and nitro.  No significant proved with nitro.  He reports compliance with blood pressure medications.  Review of Systems  Positive: Chest pain, dizziness Negative: Shortness of breath, abdominal pain, vomiting, diarrhea, extremity swelling/color change or any additional concerns.  Physical Exam  BP (!) 148/94 (BP Location: Right Arm)   Pulse 94   Temp 97.6 F (36.4 C)   Resp 17   Ht 5\' 9"  (1.753 m)   Wt 110.7 kg   SpO2 98%   BMI 36.03 kg/m  Gen:   Awake, no distress   Resp:  Normal effort  MSK:   Moves extremities without difficulty  Other:  Abdomen soft nontender.  Heart regular rate and rhythm.  Lungs clear.  Medical Decision Making  Medically screening exam initiated at 11:16 AM.  Appropriate orders placed.  was informed that the remainder of the evaluation will be completed by another provider, this initial triage assessment does not replace that evaluation, and the importance of remaining in the ED until their evaluation is complete.  Charge RN aware of hypokalemia and if patient needs a room.  Note: Portions of this report may have been transcribed using voice recognition software. Every effort was made to ensure accuracy; however, inadvertent computerized transcription errors may still be present.    [name], PA-C 08/22/22 1118

## 2022-08-22 NOTE — ED Provider Notes (Signed)
I received this patient in handoff from [employer], PA-C.  Please see her chart for additional history and work-up thus far.  In short patient is a 31 year old male with a past medical history of hypertension and hyperlipidemia presenting today with atypical chest pain.  First troponin was 8 and second was 13.  A third Theodis Aguas was ordered.  If this remains flat patient will be discharged home.  Also had a potassium of 2.5.  Appears to have a recurrent hypokalemia.  Has been repleted both orally and intravenously.  Physical Exam  BP (!) 148/95 (BP Location: Right Arm)   Pulse 79   Temp 98.1 F (36.7 C)   Resp 17   Ht 5\' 9"  (1.753 m)   Wt 110.7 kg   SpO2 96%   BMI 36.03 kg/m   Physical Exam Vitals and nursing note reviewed.  Constitutional:      Appearance: Normal appearance.  HENT:     Head: Normocephalic and atraumatic.  Eyes:     General: No scleral icterus.    Conjunctiva/sclera: Conjunctivae normal.  Cardiovascular:     Rate and Rhythm: Normal rate and regular rhythm.  Pulmonary:     Effort: Pulmonary effort is normal. No respiratory distress.  Skin:    Findings: No rash.  Neurological:     Mental Status: He is alert.  Psychiatric:        Mood and Affect: Mood normal.     Procedures  Procedures  ED Course / MDM   Clinical Course as of 08/22/22 1751  Wed Aug 22, 2022  1600 Atypical chest pain + delta getting third [CC]  1625 Pain resolved [CC]  1626 On HCTZ likely keq losses [CC]  1734 Third trop downtrending. Reassuring repeat exam. K better. Will send with replacement and 48 hour PCP ref.  [CC]    Clinical Course User Index [CC] Aug 24, 2022, MD   Medical Decision Making Amount and/or Complexity of Data Reviewed Labs: ordered. Radiology: ordered.  Risk Prescription drug management.   I went spoke with the patient and in the hallway.  He tells me that he was walking back to the bathroom and started to feel some tightness in his chest.  This  lasted up until being in the department when it slowly subsided.  He does state that he has been feeling very weak.  Tells me that he usually takes HCTZ and losartan for his blood pressure but around a month ago he stopped taking the losartan but continued the HCTZ.  Likely the etiology of his hypokalemia.  Hypokalemia likely the etiology of his fatigue and weakness.  5:45: Third troponin was within normal limits.  Patient has a heart score of 2 and ultimately will need to follow-up outpatient with cardiology.  I discussed this at length with the patient and his wife on the phone.  All of their questions about hypertension, antihypertensive medications, kidney disease and ACS were answered to their satisfaction.  He also has already messaged his PCP and hopefully will have an appointment with them this week to discuss his visit today.   Glyn Ade, PA-C 08/22/22 1753    13/08/23, MD 08/22/22 2154

## 2022-08-22 NOTE — Discharge Instructions (Addendum)
You came to the emergency department today for chest pain.  Your labs were normal outside of your potassium of 2.5.  This has been repleted.  We discussed your magnesium however this was within normal limits.  As we discussed, troponin is an enzyme that is released by the heart when it is under stress.  Your first 1 was normal, your second one was within the normal range however higher than your second 1.  For this reason we checked a third which was within normal limits and lower than your second.  At this time there is no more intervention that needs to be done in the hospital.  I have placed a referral to cardiology and they should call you in the next 72 hours to set up an appointment.  Please hold your hydrochlorothiazide for the next 2 days.  You should take your losartan.  I would like you to call your primary care tomorrow and try and get in for a hospital follow-up to discuss blood pressure medications that may be better for your kidneys.  Please do not stop your medications as there are more risks to uncontrolled hypertension than there are to these medications.  Return with any recurring or worsening symptoms, it was a pleasure to meet you and we hope you feel better!

## 2022-08-22 NOTE — ED Notes (Signed)
Potassium 2.5 

## 2022-08-22 NOTE — ED Provider Notes (Signed)
[facility] EMERGENCY DEPARTMENT Provider Note   CSN: [id number] Arrival date & time: 08/22/22  0703     History  Chief Complaint  Patient presents with   Chest Pain   Dizziness    Paul Jarvis is a 31 y.o. male, history of hypertension, hyperlipidemia, who presents to the ED secondary to chest pain that started around 6 AM this morning.  States it is constant, in the middle of his chest, not radiating to his jaw, back, abdomen.  No associated nausea, vomiting States that he felt dizzy when he was walking this morning like he is going to pass out, and that is when he had the chest pain.  No numbness, tingling of any extremities.  Denies any shortness of breath.  Was given nitroglycerin by EMS, without any relief.  States that when they gave him the potassium up in triage, and help with his chest pain it took it from a 8 out of 10 to a 6 out of 10.  He has been accidentally taking his hydrochlorothiazide, without his amlodipine valsartan.  Started hydrochlorothiazide this past month.    Home Medications Prior to Admission medications   Medication Sig Start Date End Date Taking? Authorizing Provider  acetaminophen (TYLENOL) 500 MG tablet Take 1,000 mg by mouth every 6 (six) hours as needed for mild pain.   Yes [provider]  amLODipine-valsartan (EXFORGE) 10-320 MG tablet Take 1 tablet by mouth daily. 03/19/22  Yes Tyson Alias, MD  carbamazepine (CARBATROL) 300 MG 12 hr capsule Take 300 mg by mouth daily. 08/20/22  Yes [provider]  chlorthalidone (HYGROTON) 25 MG tablet Take 1 tablet (25 mg total) by mouth daily. 08/01/22  Yes Tyson Alias, MD  doxycycline (VIBRAMYCIN) 100 MG capsule Take 1 capsule (100 mg total) by mouth 2 (two) times daily. Patient taking differently: Take 100 mg by mouth 2 (two) times daily. 10 DS 08/18/22  Yes Aberman, Merla Riches, PA-C  DULoxetine (CYMBALTA) 30 MG capsule Take 30 mg by mouth every morning.  Taking with 60 mg = 90 mg 08/19/22  Yes [provider]  DULoxetine (CYMBALTA) 60 MG capsule Take 1 capsule (60 mg total) by mouth daily. Patient taking differently: Take 60 mg by mouth daily. Taking with 30 mg = 90 mg 03/19/22  Yes Tyson Alias, MD  HYDROcodone-acetaminophen (NORCO/VICODIN) 5-325 MG tablet Take 1 tablet by mouth every 6 (six) hours as needed for severe pain. 08/18/22  Yes Aberman, Merla Riches, PA-C  Meloxicam 15 MG TBDP Take 15 mg by mouth daily as needed. Patient taking differently: Take 15 mg by mouth daily as needed (pain). 03/19/22  Yes Tyson Alias, MD  Omega 3 1000 MG CAPS Take 1,000 mg by mouth daily.   Yes [provider]  OVER THE COUNTER MEDICATION Take 2 tablets by mouth daily. CKLS   ITS for Colon,kidney, liver, spleen supplement   Yes [provider]  Turmeric (QC TUMERIC COMPLEX PO) Take 2 capsules by mouth daily. Tumeric & Ginger supplement   Yes [provider]  dicyclomine (BENTYL) 20 MG tablet Take 1 tablet (20 mg total) by mouth 3 (three) times daily as needed (Abdominal discomfort). Patient not taking: Reported on 08/22/2022 08/03/22   Tyson Alias, MD      Allergies    Patient has no known allergies.    Review of Systems   Review of Systems  Constitutional:  Positive for fatigue.  Respiratory:  Negative for shortness of  breath.   Cardiovascular:  Positive for chest pain.  Neurological:  Positive for dizziness.    Physical Exam Updated Vital Signs BP (!) 148/95 (BP Location: Right Arm)   Pulse 79   Temp 98.1 F (36.7 C)   Resp 17   Ht 5\' 9"  (1.753 m)   Wt 110.7 kg   SpO2 96%   BMI 36.03 kg/m  Physical Exam Vitals and nursing note reviewed.  Constitutional:      General: He is not in acute distress.    Appearance: He is well-developed.  HENT:     Head: Normocephalic and atraumatic.  Eyes:     Conjunctiva/sclera: Conjunctivae normal.  Cardiovascular:     Rate and Rhythm: Normal  rate and regular rhythm.     Heart sounds: No murmur heard. Pulmonary:     Effort: Pulmonary effort is normal. No respiratory distress.     Breath sounds: Normal breath sounds.  Abdominal:     Palpations: Abdomen is soft.     Tenderness: There is no abdominal tenderness.  Musculoskeletal:        General: No swelling.     Cervical back: Neck supple.  Skin:    General: Skin is warm and dry.     Capillary Refill: Capillary refill takes less than 2 seconds.  Neurological:     Mental Status: He is alert.  Psychiatric:        Mood and Affect: Mood normal.     ED Results / Procedures / Treatments   Labs (all labs ordered are listed, but only abnormal results are displayed) Labs Reviewed  BASIC METABOLIC PANEL - Abnormal; Notable for the following components:      Result Value   Potassium 2.5 (*)    Glucose, Bld 121 (*)    Creatinine, Ser 1.57 (*)    GFR, Estimated 59 (*)    All other components within normal limits  CBC  MAGNESIUM  BASIC METABOLIC PANEL  TROPONIN I (HIGH SENSITIVITY)  TROPONIN I (HIGH SENSITIVITY)  TROPONIN I (HIGH SENSITIVITY)    EKG EKG Interpretation  Date/Time:  Wednesday August 22 2022 07:18:24 EST Ventricular Rate:  80 PR Interval:  154 QRS Duration: 82 QT Interval:  374 QTC Calculation: 431 R Axis:   84 Text Interpretation: Normal sinus rhythm ST & T wave abnormality, consider inferolateral ischemia Abnormal ECG When compared with ECG of 21-Aug-2022 16:54, PREVIOUS ECG IS PRESENT T wave changes present in lateral leads and inferior leads Confirmed by 23-Aug-2022 (Vivien Rossetti) on 08/22/2022 1:47:43 PM  Radiology DG Chest 2 View  Result Date: 08/22/2022 CLINICAL DATA:  Chest pain. EXAM: CHEST - 2 VIEW COMPARISON:  Chest x-ray 06/18/2021. FINDINGS: The heart size and mediastinal contours are within normal limits. Both lungs are clear. No visible pleural effusions or pneumothorax. No acute osseous abnormality. IMPRESSION: No active cardiopulmonary  disease. Electronically Signed   By: 08/18/2021 M.D.   On: 08/22/2022 08:22    Procedures Procedures    Medications Ordered in ED Medications  potassium chloride 10 mEq in 100 mL IVPB (10 mEq Intravenous New Bag/Given 08/22/22 1411)  sodium chloride 0.9 % bolus 1,000 mL (has no administration in time range)  potassium chloride SA (KLOR-CON M) CR tablet 40 mEq (40 mEq Oral Given 08/22/22 1118)  potassium chloride SA (KLOR-CON M) CR tablet 40 mEq (40 mEq Oral Given 08/22/22 1411)    ED Course/ Medical Decision Making/ A&P  Medical Decision Making Pt is a 31 y.o. male, hx of HTN, HLD, here for chest pain starting at 6AM, persistent, not alleviated by nitroglycerin. Associated dizziness that has now resolved. Given potassium chloride by triage and he states he feels improved. Troponins ordered by triage---8-->13, atypical chest pain however, so will repeat. HEART 3. K of 2.8, which may explain dizziness, likely 2/2 to mismanagement of meds----give Kcl by triage, will give another PO and IV . Repeat K around 1400.   Amount and/or Complexity of Data Reviewed Labs: ordered.    Details: K of 2.8, troponins 8-->13 Radiology: ordered.  Risk Prescription drug management.   Care transitioned to [facility]. [place] PA Final Clinical Impression(s) / ED Diagnoses Final diagnoses:  Atypical chest pain    Rx / DC Orders ED Discharge Orders     None         Dolphus Jenny, [place]ley [place], PA 08/22/22 1510    Mardene Sayer, MD 08/23/22 [phone]

## 2022-08-22 NOTE — Telephone Encounter (Signed)
Call from patient who is currently in the ER for Chest Pains.  States was told that some of his medications could be causing his Potassium levels to go down.  Would like to have Dr. Oswaldo Done give him a call about his medications.  Can be reached at [phone].  Patient was informed that during his ER visit some of his medications may be changed

## 2022-08-22 NOTE — ED Triage Notes (Signed)
EMS stated, when he woke up and went to bathroom started getting dizzy and some chest pressure. 18 g in left AC Nitro .4 mg ASA 324

## 2022-08-23 ENCOUNTER — Telehealth: Payer: Self-pay

## 2022-08-23 NOTE — Telephone Encounter (Signed)
Thanks for letting me know.  I spoke with Paul Jarvis, he is feeling better at home. We decided to stop chlorthalidone due to hyperkalemia. He will continue only amlodipine-valsartan combo. I will see him on 11/20 for potassium recheck and we will decide what to do about hypertension.

## 2022-08-23 NOTE — Patient Outreach (Signed)
  Care Coordination TOC Note Transition Care Management Follow-up Telephone Call Date of discharge and from where: [facility] 06/22/22 How have you been since you were released from the hospital? "I am feeling ok, not 100% but a lot better" Any questions or concerns? Yes- Reviewed medications and he noted he spoke with Dr. Oswaldo Done who advised him not to take his hydrochlorothiazide until he sees him in the clinic on 09/03/22.  Items Reviewed: Did the pt receive and understand the discharge instructions provided? Yes  Medications obtained and verified? Yes  Other? No  Any new allergies since your discharge? No  Dietary orders reviewed? No Do you have support at home? Yes   Home Care and Equipment/Supplies: Were home health services ordered? no If so, what is the name of the agency? N/A  Has the agency set up a time to come to the patient's home? not applicable Were any new equipment or medical supplies ordered?  No What is the name of the medical supply agency? N/A Were you able to get the supplies/equipment? no Do you have any questions related to the use of the equipment or supplies? No  Functional Questionnaire: (I = Independent and D = Dependent) ADLs: I  Bathing/Dressing- I  Meal Prep- I  Eating- I  Maintaining continence- I  Transferring/Ambulation- I  Managing Meds- I  Follow up appointments reviewed:  PCP Hospital f/u appt confirmed? Yes  Scheduled to see Dr. Oswaldo Done on 09/03/21 @ 0815. Specialist Hospital f/u appt confirmed? No   Are transportation arrangements needed? No  If their condition worsens, is the pt aware to call PCP or go to the Emergency Dept.? Yes Was the patient provided with contact information for the PCP's office or ED? Yes Was to pt encouraged to call back with questions or concerns? Yes  SDOH assessments and interventions completed:   Yes  Care Coordination Interventions Activated:  Yes   Care Coordination Interventions:  No Care  Coordination interventions needed at this time.   Encounter Outcome:  Pt. Visit Completed

## 2022-08-24 ENCOUNTER — Ambulatory Visit: Payer: Commercial Managed Care - HMO | Attending: Cardiology | Admitting: Cardiology

## 2022-08-24 ENCOUNTER — Encounter: Payer: Self-pay | Admitting: Cardiology

## 2022-08-24 VITALS — BP 150/98 | HR 92 | Ht 69.0 in | Wt 246.0 lb

## 2022-08-24 DIAGNOSIS — R079 Chest pain, unspecified: Secondary | ICD-10-CM

## 2022-08-24 DIAGNOSIS — I10 Essential (primary) hypertension: Secondary | ICD-10-CM | POA: Diagnosis not present

## 2022-08-24 DIAGNOSIS — R072 Precordial pain: Secondary | ICD-10-CM

## 2022-08-24 LAB — TROPONIN T: Troponin T (Highly Sensitive): 10 ng/L (ref 0–22)

## 2022-08-24 MED ORDER — CARVEDILOL 3.125 MG PO TABS: 3.1250 mg | ORAL_TABLET | Freq: Two times a day (BID) | ORAL | 3 refills | Status: DC

## 2022-08-24 MED ORDER — METOPROLOL TARTRATE 100 MG PO TABS: 100.0000 mg | ORAL_TABLET | Freq: Once | ORAL | 0 refills | Status: DC

## 2022-08-24 NOTE — Patient Instructions (Addendum)
Medication Instructions:  Your physician has recommended you make the following change in your medication:  1) START taking carvedilol 3.125 mg twice daily  *If you need a refill on your cardiac medications before your next appointment, please call your pharmacy*   Lab Work: TODAY: Troponin, ESR, CRP If you have labs (blood work) drawn today and your tests are completely normal, you will receive your results only by: [place] (if you have MyChart) OR A paper copy in the mail If you have any lab test that is abnormal or we need to change your treatment, we will call you to review the results.   Testing/Procedures: Your physician has requested that you have an echocardiogram. Echocardiography is a painless test that uses sound waves to create images of your heart. It provides your doctor with information about the size and shape of your heart and how well your heart's chambers and valves are working. This procedure takes approximately one hour. There are no restrictions for this procedure. Please do NOT wear cologne, perfume, aftershave, or lotions (deodorant is allowed). Please arrive 15 minutes prior to your appointment time.  Your physician has requested that you have cardiac CT. Cardiac computed tomography (CT) is a painless test that uses an x-ray machine to take clear, detailed pictures of your heart. Please follow instructions below.   Follow-Up: At [facility], you and your health needs are our priority.  As part of our continuing mission to provide you with exceptional heart care, we have created designated Provider Care Teams.  These Care Teams include your primary Cardiologist (physician) and Advanced Practice Providers (APPs -  Physician Assistants and Nurse Practitioners) who all work together to provide you with the care you need, when you need it.  Your next appointment:   6 week(s)  The format for your next appointment:   In Person  Provider:   Fransico Him, MD  or APP    Other Instructions   Your cardiac CT will be scheduled at one of the below locations:   [facility] [address] [place], [place] [address] [phone]  [place] [address] [place], [place] [address] [phone]  [facility] [place], [place] [address] [phone]  If scheduled at [facility], please arrive at the [facility] and Children's Entrance (Entrance C2) of [facility] 30 minutes prior to test start time. You can use the FREE valet parking offered at entrance C (encouraged to control the heart rate for the test)  Proceed to the [facility] Radiology Department (first floor) to check-in and test prep.  All radiology patients and guests should use entrance C2 at [facility], accessed from [facility], even though the hospital's physical address listed is [address]    If scheduled at [facility] or [facility], please arrive 15 mins early for check-in and test prep.   Please follow these instructions carefully (unless otherwise directed):  Hold all erectile dysfunction medications at least 3 days (72 hrs) prior to test. (Ie viagra, cialis, sildenafil, tadalafil, etc) We will administer nitroglycerin during this exam.   On the Night Before the Test: Be sure to Drink plenty of water. Do not consume any caffeinated/decaffeinated beverages or chocolate 12 hours prior to your test. Do not take any antihistamines 12 hours prior to your test.  On the Day  of the Test: Drink plenty of water until 1 hour prior to the test. Do not eat any food 1 hour prior to test. You may take your regular medications prior to the test.  Take metoprolol (Lopressor) two hours prior to test.  After the Test: Drink plenty of water. After receiving IV  contrast, you may experience a mild flushed feeling. This is normal. On occasion, you may experience a mild rash up to 24 hours after the test. This is not dangerous. If this occurs, you can take Benadryl 25 mg and increase your fluid intake. If you experience trouble breathing, this can be serious. If it is severe call 911 IMMEDIATELY. If it is mild, please call our office. If you take any of these medications: Glipizide/Metformin, Avandament, Glucavance, please do not take 48 hours after completing test unless otherwise instructed.  We will call to schedule your test 2-4 weeks out understanding that some insurance companies will need an authorization prior to the service being performed.   For non-scheduling related questions, please contact the cardiac imaging nurse navigator should you have any questions/concerns: Marchia Bond, Cardiac Imaging Nurse Navigator Gordy Clement, Cardiac Imaging Nurse Navigator [facility] Direct Office Dial: [phone]   For scheduling needs, including cancellations and rescheduling, please call [place], [phone].   Important Information About Sugar
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
## 2022-08-24 NOTE — Addendum Note (Signed)
Addended by: Frutoso Schatz on: 08/24/2022 03:13 PM   Modules accepted: Orders

## 2022-08-25 DIAGNOSIS — R079 Chest pain, unspecified: Secondary | ICD-10-CM | POA: Insufficient documentation

## 2022-08-25 DIAGNOSIS — L0591 Pilonidal cyst without abscess: Secondary | ICD-10-CM | POA: Insufficient documentation
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
## 2022-08-25 NOTE — Assessment & Plan Note (Signed)
Patient presented to the emergency department for pain due to pilonidal abscess.  Underwent incision and drainage in the ED with improvement in pain.  Discharged with doxycycline which she has been taking.  States he is no longer having drainage or pain from this.  Incision appears well-healed.  We will have complete doxycycline and follow-up as needed.

## 2022-08-25 NOTE — Assessment & Plan Note (Signed)
Hypertensive to 170/119 and then 150/98 on repeat.  He has been taking chlorthalidone 25 mg daily.  Thought he was to discontinue amlodipine-valsartan and has not taking this since his visit in October.  Noted to be hypokalemic in the ED 3 days ago.  He reported some muscular pain associated with this that resolved after IV potassium.  No EKG changes noted related to hypokalemia at that time.  Suspect hypokalemia in the setting of starting chlorthalidone.  -Restart amlodipine-valsartan -Repeat BMP may need to discontinue chlorthalidone if potassium remains low -follow-up with Dr. Oswaldo Done on 09/03/2022 for hypertension follow-up

## 2022-08-25 NOTE — Assessment & Plan Note (Addendum)
Reports constant mild dull aching chest pain in the sternal area.  He first noted this while in the ED 3 days ago for pilonidal abscess while getting morphine prior to procedure.  Thinks this is related to stress states pain has not improved since then.  Denies any trauma or changes in activity that would cause chest pain.  EKG at that time with rightward axis possible inferior lateral ischemic changes.  Noted ST depression in aVL, new T wave inversions in leads III, V5, and V6.  He does have reproducible right-sided chest pain on exam today.  Repeated his EKG which showed resolved ST depression continues to have T wave inversions in lead II and V6.  He denies family history of cardiac disease.  Denies recent illicit substance use or cocaine use.  Suspect his chest pain is noncardiac in nature.  If he continues to have chest pain will benefit from cardiology evaluation versus echocardiogram.  He will discuss this further with his PCP if still having chest pain.

## 2022-08-27 NOTE — Progress Notes (Signed)
Internal Medicine Clinic Attending ? ?Case discussed with Dr. Liang  At the time of the visit.  We reviewed the resident?s history and exam and pertinent patient test results.  I agree with the assessment, diagnosis, and plan of care documented in the resident?s note. ? ?

## 2022-08-27 NOTE — Progress Notes (Deleted)
Internal Medicine Clinic Attending ? ?Case discussed with Dr. Liang  At the time of the visit.  We reviewed the resident?s history and exam and pertinent patient test results.  I agree with the assessment, diagnosis, and plan of care documented in the resident?s note. ? ?

## 2022-08-28 ENCOUNTER — Ambulatory Visit (HOSPITAL_COMMUNITY)
Admission: RE | Admit: 2022-08-28 | Discharge: 2022-08-28 | Disposition: A | Discharge status: Home / Self Care | Payer: Commercial Managed Care - HMO | Source: Ambulatory Visit | Attending: Cardiology | Admitting: Cardiology

## 2022-08-28 DIAGNOSIS — R072 Precordial pain: Secondary | ICD-10-CM | POA: Insufficient documentation

## 2022-08-28 MED ORDER — IOHEXOL 350 MG/ML SOLN
100.0000 mL | Freq: Once | INTRAVENOUS | Status: AC | PRN
  Administered 2022-08-28: 100 mL via INTRAVENOUS

## 2022-08-28 MED ORDER — NITROGLYCERIN 0.4 MG SL SUBL: 0.8000 mg | SUBLINGUAL_TABLET | Freq: Once | SUBLINGUAL | Status: AC

## 2022-08-28 MED ORDER — NITROGLYCERIN 0.4 MG SL SUBL
SUBLINGUAL_TABLET | SUBLINGUAL | Status: AC
  Administered 2022-08-28: 0.8 mg via SUBLINGUAL
  Filled 2022-08-28: qty 2

## 2022-08-29 ENCOUNTER — Ambulatory Visit (HOSPITAL_COMMUNITY): Payer: Commercial Managed Care - HMO | Attending: Cardiology

## 2022-08-30 ENCOUNTER — Encounter: Payer: Self-pay | Admitting: Cardiology

## 2022-08-30 LAB — SEDIMENTATION RATE

## 2022-08-30 LAB — C-REACTIVE PROTEIN: CRP: 8 mg/L (ref 0–10)

## 2022-09-03 ENCOUNTER — Encounter: Payer: Commercial Managed Care - HMO | Admitting: Student in an Organized Health Care Education/Training Program

## 2022-09-10 ENCOUNTER — Other Ambulatory Visit: Payer: Self-pay | Admitting: *Deleted

## 2022-09-10 DIAGNOSIS — R079 Chest pain, unspecified: Secondary | ICD-10-CM

## 2022-09-10 DIAGNOSIS — I10 Essential (primary) hypertension: Secondary | ICD-10-CM
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
## 2022-09-18 ENCOUNTER — Ambulatory Visit: Payer: Commercial Managed Care - HMO

## 2022-09-18 ENCOUNTER — Encounter: Payer: Self-pay | Admitting: Adult Health

## 2022-09-18 ENCOUNTER — Ambulatory Visit (INDEPENDENT_AMBULATORY_CARE_PROVIDER_SITE_OTHER): Payer: Commercial Managed Care - HMO | Admitting: Adult Health

## 2022-09-18 ENCOUNTER — Ambulatory Visit: Payer: Commercial Managed Care - HMO | Attending: Cardiology

## 2022-09-18 VITALS — BP 143/96 | HR 71 | Ht 69.0 in | Wt 249.2 lb

## 2022-09-18 DIAGNOSIS — I10 Essential (primary) hypertension: Secondary | ICD-10-CM | POA: Insufficient documentation

## 2022-09-18 DIAGNOSIS — R079 Chest pain, unspecified: Secondary | ICD-10-CM | POA: Insufficient documentation

## 2022-09-18 DIAGNOSIS — G4733 Obstructive sleep apnea (adult) (pediatric): Secondary | ICD-10-CM

## 2022-09-18 DIAGNOSIS — R072 Precordial pain: Secondary | ICD-10-CM | POA: Insufficient documentation

## 2022-09-18 LAB — ECHOCARDIOGRAM COMPLETE
Area-P 1/2: 3.74 cm2
S' Lateral: 3.1 cm

## 2022-09-18 LAB — SEDIMENTATION RATE: Sed Rate: 3 mm/hr (ref 0–15)

## 2022-10-01 ENCOUNTER — Encounter: Payer: Self-pay | Admitting: Student in an Organized Health Care Education/Training Program

## 2022-10-01 ENCOUNTER — Ambulatory Visit (INDEPENDENT_AMBULATORY_CARE_PROVIDER_SITE_OTHER): Payer: Commercial Managed Care - HMO | Admitting: Student in an Organized Health Care Education/Training Program

## 2022-10-01 VITALS — BP 166/94 | HR 93 | Temp 98.0°F | Ht 69.0 in | Wt 247.7 lb

## 2022-10-01 DIAGNOSIS — R351 Nocturia: Secondary | ICD-10-CM | POA: Diagnosis not present

## 2022-10-01 DIAGNOSIS — R809 Proteinuria, unspecified: Secondary | ICD-10-CM

## 2022-10-01 DIAGNOSIS — M797 Fibromyalgia: Secondary | ICD-10-CM | POA: Diagnosis not present

## 2022-10-01 DIAGNOSIS — I10 Essential (primary) hypertension: Secondary | ICD-10-CM

## 2022-10-01 DIAGNOSIS — G4733 Obstructive sleep apnea (adult) (pediatric): Secondary | ICD-10-CM

## 2022-10-01 LAB — POCT URINALYSIS DIPSTICK
Bilirubin, UA: NEGATIVE
Blood, UA: NEGATIVE
Glucose, UA: NEGATIVE
Ketones, UA: NEGATIVE
Leukocytes, UA: NEGATIVE
Nitrite, UA: NEGATIVE
Protein, UA: POSITIVE — AB
Spec Grav, UA: >=1.03 — AB (ref 1.010–1.025)
Urobilinogen, UA: 0.2 E.U./dL
pH, UA: 6 (ref 5.0–8.0)

## 2022-10-01 NOTE — Progress Notes (Signed)
   Established Patient Office Visit  Subjective   Patient ID: Paul Jarvis, male    DOB: [birth year]  Age: 31 y.o. MRN: [id number]  Chief Complaint  Patient presents with   Medication Refill    HPI  31 year old person here for follow-up of hypertension and fibromyalgia.  Doing well over the last few months.  He had some chest discomfort with exertion and was referred to cardiology.  He had an evaluation including echocardiogram which was structurally normal with no valvular disease.  Also had a CT angiography which showed no coronary calcification or atherosclerosis.  Chest discomfort has resolved.  He is back to his normal level of exertion, without any limiting symptoms.  Reports good adherence with CPAP on a nightly basis and improvements in daytime somnolence.  Reports good sleep quality.  He reports good access and adherence to medications.  No side effects, though he does wonder if Cymbalta is contributing to his elevated blood pressures.  Works full-time job as a Printmaker, busy home life with 3 young children, the youngest is 65 months old.    Objective:     BP (!) 166/94 (BP Location: Left Arm, Patient Position: Sitting, Cuff Size: Normal)   Pulse 93   Temp 98 F (36.7 C) (Oral)   Ht 5\' 9"  (1.753 m)   Wt 247 lb 11.2 oz (112.4 kg)   SpO2 100%   BMI 36.58 kg/m    Physical Exam  General: Well-appearing man, no distress CV: Regular rate and rhythm with no murmurs Lungs: Clear throughout with no wheezing or crackles Abdomen: Soft and nontender Extremities: Warm and well-perfused with no lower extremity edema and normal joints Neuro: Alert, conversational, full strength in the upper and lower extremities Psych: Appropriate affect, not depressed or anxious appearing.    Assessment & Plan:   Problem List Items Addressed This Visit       High   Hypertension - Primary (Chronic)    Blood pressure elevated above goal today.  Plan to continue amlodipine 10 mg, valsartan 320 mg  daily, and carvedilol 3.125 mg twice daily.  He had severe hypokalemia after trying a thiazide diuretic.  Given resistant hypertension and hypokalemia, will check aldosterone and renin levels to rule out hyperaldosteronism.  Obstructive sleep apnea is now under better control with CPAP.  If Aldo levels are elevated, may try spironolactone, but would like to avoid this if possible given the side effect profile in this young man.      Relevant Orders   BMP8+Anion Gap   Aldosterone/Renin   Fibromyalgia syndrome (Chronic)    Chronic and stable, well-controlled symptoms at this time.  Plan to continue with duloxetine 60 mg daily and as needed meloxicam.  He follows up with a psychiatrist who prescribes carbamazepine.  Having some bladder symptoms and no history of GI symptoms well, but these are currently stable.        Medium    Obstructive sleep apnea (Chronic)    Doing well on CPAP, reporting good improvement in sleep quality and a reduction in daytime symptoms.  Will continue with CPAP nightly.        Unprioritized   Nocturia   Relevant Orders   POCT Urinalysis Dipstick (81002)    No follow-ups on file.    , MD

## 2022-10-01 NOTE — Assessment & Plan Note (Signed)
Doing well on CPAP, reporting good improvement in sleep quality and a reduction in daytime symptoms.  Will continue with CPAP nightly.

## 2022-10-01 NOTE — Assessment & Plan Note (Signed)
Chronic and stable, well-controlled symptoms at this time.  Plan to continue with duloxetine 60 mg daily and as needed meloxicam.  He follows up with a psychiatrist who prescribes carbamazepine.  Having some bladder symptoms and no history of GI symptoms well, but these are currently stable.

## 2022-10-01 NOTE — Assessment & Plan Note (Signed)
Blood pressure elevated above goal today.  Plan to continue amlodipine 10 mg, valsartan 320 mg daily, and carvedilol 3.125 mg twice daily.  He had severe hypokalemia after trying a thiazide diuretic.  Given resistant hypertension and hypokalemia, will check aldosterone and renin levels to rule out hyperaldosteronism.  Obstructive sleep apnea is now under better control with CPAP.  If Aldo levels are elevated, may try spironolactone, but would like to avoid this if possible given the side effect profile in this young man.

## 2022-10-01 NOTE — Assessment & Plan Note (Signed)
Several year history of 2-3 episodes of nocturia nightly with recent problem of enuresis.  Urinalysis is without signs of cystitis, no dysuria to suggest infection.  He does have proteinuria on our dipstick, we will check urine protein to creatinine ratio to rule out heavy proteinuria.  We did a postvoid bladder scan which had less than 30 mL which is normal.  I suspect he has some urgency symptoms related to fibromyalgia and we talked about lifestyle modifications including timed and double voiding.

## 2022-10-02 LAB — PROTEIN / CREATININE RATIO, URINE
Creatinine, Urine: 205.5 mg/dL
Protein, Ur: 33.8 mg/dL
Protein/Creat Ratio: 164 mg/g creat (ref 0–200)
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
## 2022-10-03 ENCOUNTER — Ambulatory Visit: Payer: Commercial Managed Care - HMO | Attending: Nurse Practitioner | Admitting: Nurse Practitioner

## 2022-10-03 DIAGNOSIS — R079 Chest pain, unspecified: Secondary | ICD-10-CM

## 2022-10-08 LAB — BMP8+ANION GAP
Anion Gap: 16 mmol/L (ref 10.0–18.0)
BUN/Creatinine Ratio: 13 (ref 9–20)
BUN: 15 mg/dL (ref 6–20)
CO2: 21 mmol/L (ref 20–29)
Calcium: 9.3 mg/dL (ref 8.7–10.2)
Chloride: 102 mmol/L (ref 96–106)
Creatinine, Ser: 1.19 mg/dL (ref 0.76–1.27)
Glucose: 87 mg/dL (ref 70–99)
Potassium: 3.8 mmol/L (ref 3.5–5.2)
Sodium: 139 mmol/L (ref 134–144)
eGFR: 83 mL/min/{1.73_m2} (ref >59)

## 2022-10-08 LAB — ALDOSTERONE + RENIN ACTIVITY W/ RATIO: Aldosterone: 13 ng/dL (ref 0.0–30.0)

## 2022-10-20 DIAGNOSIS — G4733 Obstructive sleep apnea (adult) (pediatric): Secondary | ICD-10-CM | POA: Diagnosis not present

## 2022-10-20 DIAGNOSIS — I10 Essential (primary) hypertension: Secondary | ICD-10-CM | POA: Diagnosis not present

## 2022-12-25 ENCOUNTER — Encounter: Payer: Self-pay | Admitting: Student

## 2022-12-25 ENCOUNTER — Ambulatory Visit (INDEPENDENT_AMBULATORY_CARE_PROVIDER_SITE_OTHER): Payer: BC Managed Care – PPO | Admitting: Student

## 2022-12-25 VITALS — BP 189/104 | HR 93 | Temp 98.6°F | Ht 69.0 in | Wt 247.0 lb

## 2022-12-25 DIAGNOSIS — G4733 Obstructive sleep apnea (adult) (pediatric): Secondary | ICD-10-CM | POA: Diagnosis not present

## 2022-12-25 DIAGNOSIS — R109 Unspecified abdominal pain: Secondary | ICD-10-CM

## 2022-12-25 DIAGNOSIS — M797 Fibromyalgia: Secondary | ICD-10-CM | POA: Diagnosis not present

## 2022-12-25 DIAGNOSIS — I10 Essential (primary) hypertension: Secondary | ICD-10-CM

## 2022-12-25 NOTE — Assessment & Plan Note (Signed)
Patient has history of fibromyalgia managed with the pain medications and carbamazepine. He was recently switched from duloxetine to another medication, the name of which he is unable to recall, by his psychiatrist. Given timeline of duloxetine discontinuation, increased anxiety, and abdominal pain, resuming duloxetine may prove beneficial. Blood pressure should respond to better adherence to his BP regimen and should not preclude resuming duloxetine.  - Schedule follow-up with psychiatrist for possible medication adjustment given worsening anxiety

## 2022-12-25 NOTE — Patient Instructions (Signed)
  Thank you, [name], for allowing [place] to provide your care today. Today we discussed . . .  > Abdominal Pain       - your abdominal pain is likely related to your stress level, diet, blood pressure, and sleep apnea       - please schedule close follow-up with your psychiatrist who can adjust your anxiety medications as needed       - please avoid triggering foods, start taking your blood pressure medications regularly, and wearing your CPAP every night       - we are ordering a lab test to rule out anything more serious and will call you with the results    I have ordered the following labs for you:  Lab Orders         CMP14 + Anion Gap        Tests ordered today:  none   Referrals ordered today:   Referral Orders  No referral(s) requested today      I have ordered the following medication/changed the following medications:   Stop the following medications: There are no discontinued medications.   Start the following medications: No orders of the defined types were placed in this encounter.     Follow up:  1 month     Remember:  Please avoid triggering foods, visit your psychiatrist for medication adjustments, take your blood pressure medications, and wear your CPAP regularly. We will call you with your lab test results and see you again in about one month!   Should you have any questions or concerns please call the internal medicine clinic at [phone].     Roswell Nickel, MD [place]

## 2022-12-25 NOTE — Assessment & Plan Note (Addendum)
Patient reports worsening epigastric and abdominal pain over the last month. Describes the pain as a perpetual soreness, intense dull pain, and sometimes burning. Located below his pectoral muscles greater on left and involves most of his entire left side including flank and left lower back. Previously experienced similar symptoms, but they have never been quite this bad. He describes feeling sick from a stomach virus that caused him to vomit once or twice, and he never returned to normal afterward. Pain worsens with food, certain types such as grains or fried foods are worse. Tolerates fruits and smoothies well. He has been taking potassium supplements almost daily for the past month, he notices an improvement in his symptoms about 30-25mn after taking them. 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 Although he has OSA and uses CPAP at night, he has been using this inconsistently over the past month. Additionally, his psychiatrist discontinued his duloxetine and prescribed another unknown medication almost exactly one month ago. Denies hematochezia, melena, emesis, nausea, and hematemesis. Reports diarrhea in terms of consistency, but regular bowel movement frequency about 1x per day. Abdominal exam unremarkable and pain reproducible with palpation over superior portion of rectus abdominus bilaterally. Exam also demonstrated tightness along left lateral lower back overlying the latissimus dorsi. Pain worsened with spine extension and improved slightly with flexion. Overall, this pain seems primarily muscular in nature. Low concern for GERD or mesenteric ischemia given reproducible nature and distribution of pain. Gallbladder pathology is more commonly exacerbated by greasy or fatty foods and, in contrast, this pain is worsened by a wide variety of other items. Etiology of pain most likely multifactorial  including food sensitivity, increased stress, medication change, uncontrolled hypertension, and poor-quality sleep. Tension was readily apparent in the involved muscle groups on exam.  - Encourage dietary exploration and avoidance of exacerbating foods - Encourage consistent daily adherence to blood pressure medications - Encourage consistent daily adherence to CPAP machine at night during sleep - Encourage close follow-up with psychiatrist for possible medication adjustment - Check CMP for potassium level and total bilirubin

## 2022-12-25 NOTE — Assessment & Plan Note (Signed)
Patient has history of hypertension managed with amlodipine, valsartan, and carvedilol. He previously tried a thiazide diuretic, but experienced severe hypokalemia. Reports inconsistent adherence to these medications, skipping multiple doses per week. Recent etiologic workup for hyperaldosteronism with aldosterone level was negative. Today in clinic, BP was 189/104. He denies lightheadedness, dizziness, blurry vision, and headache. Achieving better blood pressure control will rely on improving medication adherence. Counseling was provided, concerns addressed, and patient agreed to take his medications more regularly.  - Continue amlodipine-valsartan 10-'320mg'$  q24 and carvedilol 3.'125mg'$  q12
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
## 2022-12-25 NOTE — Assessment & Plan Note (Signed)
Patient has history of obstructive sleep apnea and uses CPAP at night. He reports inconsistent adherence due to falling asleep at inconvenient times in different locations. Discussed importance of CPAP adherence which can improve blood pressure, mood, and in turn abdominal pain.  - Continue CPAP nightly

## 2022-12-26 LAB — CMP14 + ANION GAP
ALT: 16 IU/L (ref 0–44)
AST: 19 IU/L (ref 0–40)
Albumin/Globulin Ratio: 2.1 (ref 1.2–2.2)
Albumin: 4.6 g/dL (ref 4.1–5.1)
Alkaline Phosphatase: 78 IU/L (ref 44–121)
Anion Gap: 17 mmol/L (ref 10.0–18.0)
BUN/Creatinine Ratio: 14 (ref 9–20)
BUN: 16 mg/dL (ref 6–20)
Bilirubin Total: <0.2 mg/dL (ref 0.0–1.2)
CO2: 21 mmol/L (ref 20–29)
Calcium: 9.5 mg/dL (ref 8.7–10.2)
Chloride: 101 mmol/L (ref 96–106)
Creatinine, Ser: 1.18 mg/dL (ref 0.76–1.27)
Globulin, Total: 2.2 g/dL (ref 1.5–4.5)
Glucose: 97 mg/dL (ref 70–99)
Potassium: 4 mmol/L (ref 3.5–5.2)
Sodium: 139 mmol/L (ref 134–144)
Total Protein: 6.8 g/dL (ref 6.0–8.5)
eGFR: 84 mL/min/{1.73_m2} (ref >59)

## 2023-01-03 NOTE — Progress Notes (Signed)
Internal Medicine Clinic Attending  Case discussed with Dr. Harper  At the time of the visit.  We reviewed the resident's history and exam and pertinent patient test results.  I agree with the assessment, diagnosis, and plan of care documented in the resident's note.  

## 2023-02-08 DIAGNOSIS — F902 Attention-deficit hyperactivity disorder, combined type: Secondary | ICD-10-CM | POA: Diagnosis not present

## 2023-02-14 DIAGNOSIS — F900 Attention-deficit hyperactivity disorder, predominantly inattentive type: Secondary | ICD-10-CM | POA: Diagnosis not present

## 2023-02-15 DIAGNOSIS — F3181 Bipolar II disorder: Secondary | ICD-10-CM | POA: Diagnosis not present

## 2023-02-15 DIAGNOSIS — F411 Generalized anxiety disorder: Secondary | ICD-10-CM | POA: Diagnosis not present

## 2023-02-21 DIAGNOSIS — F900 Attention-deficit hyperactivity disorder, predominantly inattentive type: Secondary | ICD-10-CM | POA: Diagnosis not present

## 2023-02-28 DIAGNOSIS — F4323 Adjustment disorder with mixed anxiety and depressed mood: Secondary | ICD-10-CM | POA: Diagnosis not present

## 2023-03-07 DIAGNOSIS — F411 Generalized anxiety disorder: Secondary | ICD-10-CM | POA: Diagnosis not present

## 2023-03-14 DIAGNOSIS — F419 Anxiety disorder, unspecified: Secondary | ICD-10-CM | POA: Diagnosis not present

## 2023-03-21 DIAGNOSIS — F411 Generalized anxiety disorder: Secondary | ICD-10-CM | POA: Diagnosis not present

## 2023-03-28 ENCOUNTER — Encounter: Payer: Self-pay | Admitting: *Deleted

## 2023-03-28 DIAGNOSIS — F900 Attention-deficit hyperactivity disorder, predominantly inattentive type: Secondary | ICD-10-CM | POA: Diagnosis not present

## 2023-04-01 ENCOUNTER — Telehealth: Payer: Medicaid Other | Admitting: Adult Health

## 2023-04-02 ENCOUNTER — Ambulatory Visit: Payer: Commercial Managed Care - HMO | Admitting: Adult Health

## 2023-04-03 DIAGNOSIS — F900 Attention-deficit hyperactivity disorder, predominantly inattentive type: Secondary | ICD-10-CM | POA: Diagnosis not present

## 2023-04-29 ENCOUNTER — Other Ambulatory Visit: Payer: Self-pay

## 2023-04-29 MED ORDER — MELOXICAM 15 MG PO TBDP: 15.0000 mg | ORAL_TABLET | Freq: Every day | ORAL | 2 refills | Status: AC | PRN

## 2023-05-03 DIAGNOSIS — F900 Attention-deficit hyperactivity disorder, predominantly inattentive type: Secondary | ICD-10-CM | POA: Diagnosis not present

## 2023-05-17 DIAGNOSIS — F411 Generalized anxiety disorder: Secondary | ICD-10-CM | POA: Diagnosis not present

## 2023-05-17 DIAGNOSIS — F332 Major depressive disorder, recurrent severe without psychotic features: Secondary | ICD-10-CM | POA: Diagnosis not present

## 2023-05-17 DIAGNOSIS — F3181 Bipolar II disorder: Secondary | ICD-10-CM | POA: Diagnosis not present

## 2023-05-20 ENCOUNTER — Other Ambulatory Visit: Payer: Self-pay

## 2023-05-20 MED ORDER — AMLODIPINE BESYLATE-VALSARTAN 10-320 MG PO TABS: 1.0000 | ORAL_TABLET | Freq: Every day | ORAL | 3 refills | Status: DC

## 2023-05-22 ENCOUNTER — Other Ambulatory Visit: Payer: Self-pay | Admitting: Student in an Organized Health Care Education/Training Program

## 2023-05-22 MED ORDER — AMLODIPINE BESYLATE-VALSARTAN 10-320 MG PO TABS: 1.0000 | ORAL_TABLET | Freq: Every day | ORAL | 0 refills | Status: DC

## 2023-05-22 NOTE — Telephone Encounter (Signed)
The pt is requesting a 30 day supply instead of a 90 day supply. The pt states he can not afford the cost of $60.00 of the medication listed below.. The pt was instructed to call his PCP.  Please call the patient back  amLODipine-valsartan (EXFORGE) 10-320 MG tablet   [id number] - [place], [place] - [address]

## 2023-05-23 NOTE — Telephone Encounter (Signed)
Pt informed that he can request supply from pharmacy prior to next refill Per pharmacy, pt has already picked up supply  Pt in agreement  No further action needed Phone call complete

## 2023-05-24 DIAGNOSIS — F900 Attention-deficit hyperactivity disorder, predominantly inattentive type: Secondary | ICD-10-CM | POA: Diagnosis not present

## 2023-05-29 DIAGNOSIS — F900 Attention-deficit hyperactivity disorder, predominantly inattentive type: Secondary | ICD-10-CM | POA: Diagnosis not present

## 2023-06-07 DIAGNOSIS — F900 Attention-deficit hyperactivity disorder, predominantly inattentive type: Secondary | ICD-10-CM | POA: Diagnosis not present

## 2023-06-12 DIAGNOSIS — F900 Attention-deficit hyperactivity disorder, predominantly inattentive type: Secondary | ICD-10-CM | POA: Diagnosis not present

## 2023-06-18 ENCOUNTER — Encounter: Payer: Self-pay | Admitting: Pharmacist

## 2023-06-20 ENCOUNTER — Emergency Department (HOSPITAL_COMMUNITY)
Admission: EM | Admit: 2023-06-20 | Discharge: 2023-06-20 | Disposition: A | Discharge status: Home / Self Care | Payer: BC Managed Care – PPO | Attending: Emergency Medicine | Admitting: Emergency Medicine

## 2023-06-20 ENCOUNTER — Emergency Department (HOSPITAL_COMMUNITY): Payer: BC Managed Care – PPO

## 2023-06-20 ENCOUNTER — Encounter (HOSPITAL_COMMUNITY): Payer: Self-pay | Admitting: Emergency Medicine

## 2023-06-20 ENCOUNTER — Other Ambulatory Visit: Payer: Self-pay

## 2023-06-20 DIAGNOSIS — M25461 Effusion, right knee: Secondary | ICD-10-CM | POA: Diagnosis not present

## 2023-06-20 DIAGNOSIS — M25561 Pain in right knee: Secondary | ICD-10-CM | POA: Insufficient documentation

## 2023-06-20 DIAGNOSIS — X501XXA Overexertion from prolonged static or awkward postures, initial encounter: Secondary | ICD-10-CM | POA: Diagnosis not present

## 2023-06-20 DIAGNOSIS — Z79899 Other long term (current) drug therapy: Secondary | ICD-10-CM | POA: Diagnosis not present

## 2023-06-20 DIAGNOSIS — I10 Essential (primary) hypertension: Secondary | ICD-10-CM | POA: Insufficient documentation

## 2023-06-20 DIAGNOSIS — S80911A Unspecified superficial injury of right knee, initial encounter: Secondary | ICD-10-CM | POA: Diagnosis not present

## 2023-06-20 MED ORDER — KETOROLAC TROMETHAMINE 15 MG/ML IJ SOLN
15.0000 mg | Freq: Once | INTRAMUSCULAR | Status: AC
  Administered 2023-06-20: 15 mg via INTRAMUSCULAR
  Filled 2023-06-20: qty 1
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
## 2023-06-20 NOTE — ED Notes (Signed)
Hourly rounded the pt. Is snoring sound asleep

## 2023-06-20 NOTE — Progress Notes (Signed)
Orthopedic Tech Progress Note Patient Details:  Paul Jarvis 1991/03/05 [id number]  Ortho Devices Type of Ortho Device: Knee Sleeve, Crutches Ortho Device/Splint Location: RLE Ortho Device/Splint Interventions: Ordered, Application, Adjustment   Post Interventions Patient Tolerated: Well, Ambulated well Instructions Provided: Care of device, Poper ambulation with device, Adjustment of device  [place] A Gerilyn Pilgrim 06/20/2023, 7:36 PM
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
## 2023-06-20 NOTE — Discharge Instructions (Addendum)
You were seen today for right knee pain.  Your x-ray showed some swelling but no broken or dislocated bones.  You can continue to take Tylenol and ibuprofen as needed for pain.  Please return to the emergency department for new or worsening pain, numbness, tingling, weakness.

## 2023-06-24 ENCOUNTER — Ambulatory Visit (INDEPENDENT_AMBULATORY_CARE_PROVIDER_SITE_OTHER): Payer: BC Managed Care – PPO | Admitting: Student in an Organized Health Care Education/Training Program

## 2023-06-24 ENCOUNTER — Encounter: Payer: Self-pay | Admitting: Student in an Organized Health Care Education/Training Program

## 2023-06-24 VITALS — BP 182/112 | HR 77 | Temp 98.1°F | Ht 69.0 in | Wt 249.9 lb

## 2023-06-24 DIAGNOSIS — E782 Mixed hyperlipidemia: Secondary | ICD-10-CM

## 2023-06-24 DIAGNOSIS — M797 Fibromyalgia: Secondary | ICD-10-CM

## 2023-06-24 DIAGNOSIS — I10 Essential (primary) hypertension: Secondary | ICD-10-CM | POA: Diagnosis not present

## 2023-06-24 DIAGNOSIS — S8991XA Unspecified injury of right lower leg, initial encounter: Secondary | ICD-10-CM | POA: Insufficient documentation

## 2023-06-24 DIAGNOSIS — S8991XD Unspecified injury of right lower leg, subsequent encounter: Secondary | ICD-10-CM

## 2023-06-24 DIAGNOSIS — G4733 Obstructive sleep apnea (adult) (pediatric): Secondary | ICD-10-CM

## 2023-06-24 MED ORDER — DULOXETINE HCL 60 MG PO CPEP: 60.0000 mg | ORAL_CAPSULE | Freq: Every day | ORAL | 3 refills | Status: AC

## 2023-06-24 MED ORDER — SPIRONOLACTONE 25 MG PO TABS: 25.0000 mg | ORAL_TABLET | Freq: Every day | ORAL | 1 refills | Status: DC
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
## 2023-06-24 NOTE — Patient Instructions (Addendum)
Thank you, Paul Jarvis for allowing [place] to provide your care today. Today we discussed the following.   Hypertension- Your blood pressure is uncontrolled today. There are several reasons including genetic, sleep apnea, and weight/exercise.  Try to control your diet and exercise more.  Use your CPAP machine more often.  Continue amlodipine-valsartan 10-320 mg, and carvedilol 3.125 mg  Start Spironolactone 1 tablet, once a day.  Monitor your blood pressures at home and try to keep a log at home.  Labs - We are checking labs to check electrolytes, cholesterol, uric acid and aldosterone/renin.  Knee Injury - Please continue to wear your brace for additional 1 week. You may take it off at night.  Follow up in 4 weeks.    I have ordered the following labs for you:  Lab Orders         CMP14 + Anion Gap         Lipid Profile         Aldosterone/Renin         Uric acid       Referrals ordered today:   Referral Orders  No referral(s) requested today     I have ordered the following medication/changed the following medications:   Stop the following medications: Medications Discontinued During This Encounter  Medication Reason   DULoxetine (CYMBALTA) 60 MG capsule Reorder   carbamazepine (CARBATROL) 300 MG 12 hr capsule      Start the following medications: Meds ordered this encounter  Medications   spironolactone (ALDACTONE) 25 MG tablet    Sig: Take 1 tablet (25 mg total) by mouth daily.    Dispense:  30 tablet    Refill:  1   DULoxetine (CYMBALTA) 60 MG capsule    Sig: Take 1 capsule (60 mg total) by mouth daily.    Dispense:  90 capsule    Refill:  3    Please dispense delayed release capsule     Follow up:  4 weeks    Remember:   We look forward to seeing you next time. Please call our clinic at [phone] if you have any questions or concerns. The best time to call is Monday-Friday from 9am-4pm, but there is someone available 24/7. If after hours or the weekend,  call the main hospital number and ask for the Internal Medicine Resident On-Call. If you need medication refills, please notify your pharmacy one week in advance and they will send [place] a request.   Thank you for trusting [place] with your care. Wishing you the best! [facility]
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
## 2023-06-24 NOTE — Assessment & Plan Note (Addendum)
Chronic condition. Has episodes of chest palpitations, SOB, and can feel when his blood pressures are rising. Denies dizziness, vision changes, and headaches. Has OSA previously using CPAP but has stopped since February due to not wanting to clean it often.  Does not check blood pressures at home. Adherent to amlodipine-valsartan 10-320 mg and Carvedilol 3.125 mg every night. Missed a dose 2 days ago. BP today 177/115 and 182/112 when repeated. Thiazides not tolerated previously causing hypokalemia and was stopped. Aldosterone/ renin labs were inconclusive in Jul 26, 2022. Mentioned uncle passed away due to high bp at age of 68.   Assessment.  BP not controlled on current regimen. Unusual for patient on 2 medication to have it this high. Need to further assess and rule out secondary causes (hyperaldosteronism, kidney disease, pheochromocytoma).   Plan  - Continue amlodipine-valsartan 10-320 mg once daily - Continue Carvedilol 3.125 mg once daily - Start 25 mg spironolactone once daily  - Monitor blood pressures at home  - Check CMP/ anion gap, aldosterone/renin, uric acid and lipids - Restart using CPAP  - f/u in 4 weeks

## 2023-06-24 NOTE — Assessment & Plan Note (Signed)
Currently not well treated due to intolerance of CPAP mask.  I offered our support and helping him find a mask that is a good fit.  He is motivated to do better with cleaning of the mask to help him tolerate it better.

## 2023-06-24 NOTE — Progress Notes (Signed)
Attestation for Student Documentation:  I personally was present and re-performed the history, physical exam and medical decision-making activities of this service and have verified that the service and findings are accurately documented in the student's note.  32 year old person here for follow-up of hypertension.  Blood pressure is still chronically severely elevated, this is asymptomatic.  Reports good adherence with amlodipine, valsartan, and carvedilol.  He has untreated sleep apnea that may be contributing.  Also has significant stress at home, he has obesity with a BMI currently of 37 which is stable.  At last visit we attempted to check aldosterone and renin levels, but the renin was unable to be measured due to an unknown substance.  On my reading this can happen from some medications like valsartan, could also be interference from triglycerides, cholesterol, bilirubin, or uric acid.  Will check those substances and try to measure a renin again to rule out hyperaldosteronism.  Will also start spironolactone 25 mg daily.  Follow-up with me in 1 month for potassium check.  I recommended home blood pressure monitoring, would like to review some of those readings at our next visit.  Tyson Alias, MD 06/24/2023, 12:23 PM

## 2023-06-24 NOTE — Assessment & Plan Note (Signed)
Stable and chronic.  Plan to continue with duloxetine 60 mg daily.  Having some increased soreness in his right leg after a fall, I counseled him that likely he will have soreness for couple weeks.

## 2023-06-29 LAB — LIPID PANEL
Chol/HDL Ratio: 5.6 ratio — ABNORMAL HIGH (ref 0.0–5.0)
Cholesterol, Total: 225 mg/dL — ABNORMAL HIGH (ref 100–199)
HDL: 40 mg/dL (ref >39)
LDL Chol Calc (NIH): 157 mg/dL — ABNORMAL HIGH (ref 0–99)
Triglycerides: 151 mg/dL — ABNORMAL HIGH (ref 0–149)
VLDL Cholesterol Cal: 28 mg/dL (ref 5–40)

## 2023-06-29 LAB — CMP14 + ANION GAP
ALT: 15 IU/L (ref 0–44)
AST: 18 IU/L (ref 0–40)
Albumin: 4.5 g/dL (ref 4.1–5.1)
Alkaline Phosphatase: 90 IU/L (ref 44–121)
Anion Gap: 13 mmol/L (ref 10.0–18.0)
BUN/Creatinine Ratio: 12 (ref 9–20)
BUN: 16 mg/dL (ref 6–20)
Bilirubin Total: 0.2 mg/dL (ref 0.0–1.2)
CO2: 23 mmol/L (ref 20–29)
Calcium: 9.6 mg/dL (ref 8.7–10.2)
Chloride: 103 mmol/L (ref 96–106)
Creatinine, Ser: 1.38 mg/dL — ABNORMAL HIGH (ref 0.76–1.27)
Globulin, Total: 2.4 g/dL (ref 1.5–4.5)
Glucose: 92 mg/dL (ref 70–99)
Potassium: 3.9 mmol/L (ref 3.5–5.2)
Sodium: 139 mmol/L (ref 134–144)
Total Protein: 6.9 g/dL (ref 6.0–8.5)
eGFR: 69 mL/min/{1.73_m2} (ref >59)

## 2023-06-29 LAB — ALDOSTERONE + RENIN ACTIVITY W/ RATIO
Aldos/Renin Ratio: 1.7 (ref 0.0–30.0)
Aldosterone: 15.2 ng/dL (ref 0.0–30.0)
Renin Activity, Plasma: 9.045 ng/mL/h — ABNORMAL HIGH (ref 0.167–5.380)

## 2023-06-29 LAB — URIC ACID: Uric Acid: 4.5 mg/dL (ref 3.8–8.4)

## 2023-07-29 ENCOUNTER — Encounter: Payer: BC Managed Care – PPO | Admitting: Student in an Organized Health Care Education/Training Program

## 2023-08-02 ENCOUNTER — Other Ambulatory Visit: Payer: Self-pay

## 2023-08-02 NOTE — Progress Notes (Signed)
Paul Jarvis 1990-10-30 [id number]  Patient outreached by Sofie Rower , PharmD on 08/02/23.  Blood Pressure Readings: Does the patient have a validated home blood pressure machine?: Yes He has not been taking readings recently.   Medication review was performed. Is the patient taking their medications as prescribed?: Yes Differences from their prescribed list include: None  The following barriers to adherence were noted: Does the patient have cost concerns?: No Does the patient have transportation concerns?: No Does the patient occassionally forget to take some of their prescribed medications?: No Does the patient feel like one/some of their medications make them feel poorly?: Yes Does the patient have questions or concerns about their medications?: No Does the patient have a follow up scheduled with their primary care provider/cardiologist?: Yes  Reports a chest tightness/tenderness due to Spironolactone.   Interventions: Interventions Completed: Medications were reviewed, Patient was educated on proper technique to check home blood pressure and reminded to bring home machine and readings to next provider appointment, Patient was educated on goal blood pressures and long term health implications of elevated blood pressure.  The patient has follow up scheduled:  PCP: Tyson Alias, MD   Gwenlyn Found, [facility]

## 2023-08-05 ENCOUNTER — Encounter: Payer: BC Managed Care – PPO | Admitting: Student in an Organized Health Care Education/Training Program

## 2023-08-14 ENCOUNTER — Other Ambulatory Visit: Payer: Self-pay | Admitting: Internal Medicine

## 2023-08-14 NOTE — Telephone Encounter (Signed)
LOV 06/24/23 with f/u in 4 weeks.  Pt was called to schedule an appt - no answer; message left on self-identified vm to call the office.

## 2023-08-19 DIAGNOSIS — F411 Generalized anxiety disorder: Secondary | ICD-10-CM | POA: Diagnosis not present

## 2023-08-19 DIAGNOSIS — F3181 Bipolar II disorder: Secondary | ICD-10-CM | POA: Diagnosis not present

## 2023-09-08 DIAGNOSIS — M25511 Pain in right shoulder: Secondary | ICD-10-CM | POA: Diagnosis not present

## 2023-09-08 DIAGNOSIS — M5412 Radiculopathy, cervical region: Secondary | ICD-10-CM | POA: Diagnosis not present

## 2023-11-07 ENCOUNTER — Encounter: Payer: Self-pay | Admitting: *Deleted

## 2023-11-07 ENCOUNTER — Other Ambulatory Visit: Payer: Self-pay | Admitting: Cardiology

## 2023-11-11 ENCOUNTER — Other Ambulatory Visit: Payer: Self-pay

## 2023-11-11 DIAGNOSIS — I10 Essential (primary) hypertension: Secondary | ICD-10-CM

## 2023-11-13 ENCOUNTER — Other Ambulatory Visit: Payer: Self-pay

## 2023-11-13 MED ORDER — AMLODIPINE BESYLATE-VALSARTAN 10-320 MG PO TABS: 1.0000 | ORAL_TABLET | Freq: Every day | ORAL | 3 refills | Status: DC

## 2023-11-13 MED ORDER — SPIRONOLACTONE 25 MG PO TABS: 25.0000 mg | ORAL_TABLET | Freq: Every day | ORAL | 1 refills | Status: DC

## 2023-11-13 NOTE — Telephone Encounter (Signed)
Patient is scheduled to come in 1/31.

## 2023-11-15 ENCOUNTER — Other Ambulatory Visit (INDEPENDENT_AMBULATORY_CARE_PROVIDER_SITE_OTHER): Payer: BC Managed Care – PPO

## 2023-11-15 DIAGNOSIS — I10 Essential (primary) hypertension: Secondary | ICD-10-CM

## 2023-11-17 LAB — BMP8+ANION GAP
Anion Gap: 16 mmol/L (ref 10.0–18.0)
BUN/Creatinine Ratio: 9 (ref 9–20)
BUN: 14 mg/dL (ref 6–20)
CO2: 22 mmol/L (ref 20–29)
Calcium: 9 mg/dL (ref 8.7–10.2)
Chloride: 103 mmol/L (ref 96–106)
Creatinine, Ser: 1.52 mg/dL — ABNORMAL HIGH (ref 0.76–1.27)
Glucose: 103 mg/dL — ABNORMAL HIGH (ref 70–99)
Potassium: 4.3 mmol/L (ref 3.5–5.2)
Sodium: 141 mmol/L (ref 134–144)
eGFR: 61 mL/min/{1.73_m2} (ref >59)

## 2023-11-18 ENCOUNTER — Encounter: Payer: Self-pay | Admitting: Internal Medicine

## 2024-01-08 ENCOUNTER — Ambulatory Visit: Admitting: Internal Medicine

## 2024-01-08 ENCOUNTER — Other Ambulatory Visit: Payer: Self-pay

## 2024-01-08 ENCOUNTER — Emergency Department (HOSPITAL_COMMUNITY)

## 2024-01-08 ENCOUNTER — Observation Stay (HOSPITAL_COMMUNITY)
Admission: EM | Admit: 2024-01-08 | Discharge: 2024-01-09 | Disposition: A | Discharge status: Home / Self Care | Attending: Emergency Medicine | Admitting: Emergency Medicine

## 2024-01-08 ENCOUNTER — Encounter (HOSPITAL_COMMUNITY): Payer: Self-pay

## 2024-01-08 DIAGNOSIS — F319 Bipolar disorder, unspecified: Secondary | ICD-10-CM | POA: Diagnosis present

## 2024-01-08 DIAGNOSIS — J101 Influenza due to other identified influenza virus with other respiratory manifestations: Secondary | ICD-10-CM | POA: Diagnosis not present

## 2024-01-08 DIAGNOSIS — R000 Tachycardia, unspecified: Secondary | ICD-10-CM | POA: Diagnosis not present

## 2024-01-08 DIAGNOSIS — Z1152 Encounter for screening for COVID-19: Secondary | ICD-10-CM | POA: Diagnosis not present

## 2024-01-08 DIAGNOSIS — I161 Hypertensive emergency: Secondary | ICD-10-CM

## 2024-01-08 DIAGNOSIS — M797 Fibromyalgia: Secondary | ICD-10-CM | POA: Diagnosis present

## 2024-01-08 DIAGNOSIS — M549 Dorsalgia, unspecified: Secondary | ICD-10-CM | POA: Diagnosis not present

## 2024-01-08 DIAGNOSIS — N2889 Other specified disorders of kidney and ureter: Secondary | ICD-10-CM | POA: Diagnosis not present

## 2024-01-08 DIAGNOSIS — R509 Fever, unspecified: Secondary | ICD-10-CM | POA: Diagnosis present

## 2024-01-08 DIAGNOSIS — R809 Proteinuria, unspecified: Secondary | ICD-10-CM | POA: Insufficient documentation

## 2024-01-08 DIAGNOSIS — I10 Essential (primary) hypertension: Secondary | ICD-10-CM | POA: Diagnosis not present

## 2024-01-08 DIAGNOSIS — E876 Hypokalemia: Secondary | ICD-10-CM | POA: Insufficient documentation

## 2024-01-08 DIAGNOSIS — N289 Disorder of kidney and ureter, unspecified: Secondary | ICD-10-CM

## 2024-01-08 DIAGNOSIS — R0789 Other chest pain: Secondary | ICD-10-CM | POA: Diagnosis present

## 2024-01-08 DIAGNOSIS — G4733 Obstructive sleep apnea (adult) (pediatric): Secondary | ICD-10-CM | POA: Diagnosis present

## 2024-01-08 DIAGNOSIS — R1013 Epigastric pain: Secondary | ICD-10-CM | POA: Diagnosis not present

## 2024-01-08 DIAGNOSIS — Z79899 Other long term (current) drug therapy: Secondary | ICD-10-CM | POA: Insufficient documentation

## 2024-01-08 DIAGNOSIS — R079 Chest pain, unspecified: Principal | ICD-10-CM

## 2024-01-08 DIAGNOSIS — E785 Hyperlipidemia, unspecified: Secondary | ICD-10-CM | POA: Diagnosis present

## 2024-01-08 LAB — MAGNESIUM: Magnesium: 1.8 mg/dL (ref 1.7–2.4)

## 2024-01-08 LAB — CBC WITH DIFFERENTIAL/PLATELET
Abs Immature Granulocytes: 0.02 10*3/uL (ref 0.00–0.07)
Basophils Absolute: 0 10*3/uL (ref 0.0–0.1)
Basophils Relative: 0 %
Eosinophils Absolute: 0.2 10*3/uL (ref 0.0–0.5)
Eosinophils Relative: 3 %
HCT: 44.6 % (ref 39.0–52.0)
Hemoglobin: 15 g/dL (ref 13.0–17.0)
Immature Granulocytes: 0 %
Lymphocytes Relative: 9 %
Lymphs Abs: 0.5 10*3/uL — ABNORMAL LOW (ref 0.7–4.0)
MCH: 27.3 pg (ref 26.0–34.0)
MCHC: 33.6 g/dL (ref 30.0–36.0)
MCV: 81.2 fL (ref 80.0–100.0)
Monocytes Absolute: 0.5 10*3/uL (ref 0.1–1.0)
Monocytes Relative: 10 %
Neutro Abs: 4.3 10*3/uL (ref 1.7–7.7)
Neutrophils Relative %: 78 %
Platelets: 305 10*3/uL (ref 150–400)
RBC: 5.49 MIL/uL (ref 4.22–5.81)
RDW: 14.4 % (ref 11.5–15.5)
WBC: 5.6 10*3/uL (ref 4.0–10.5)
nRBC: 0 % (ref 0.0–0.2)

## 2024-01-08 LAB — HEMOGLOBIN A1C
Hgb A1c MFr Bld: 4.7 % — ABNORMAL LOW (ref 4.8–5.6)
Mean Plasma Glucose: 88.19 mg/dL

## 2024-01-08 LAB — RESP PANEL BY RT-PCR (RSV, FLU A&B, COVID)  RVPGX2
Influenza A by PCR: POSITIVE — AB
Influenza B by PCR: NEGATIVE
Resp Syncytial Virus by PCR: NEGATIVE
SARS Coronavirus 2 by RT PCR: NEGATIVE

## 2024-01-08 LAB — HIV ANTIBODY (ROUTINE TESTING W REFLEX): HIV Screen 4th Generation wRfx: NONREACTIVE

## 2024-01-08 LAB — COMPREHENSIVE METABOLIC PANEL
ALT: 19 U/L (ref 0–44)
AST: 24 U/L (ref 15–41)
Albumin: 4.5 g/dL (ref 3.5–5.0)
Alkaline Phosphatase: 85 U/L (ref 38–126)
Anion gap: 10 (ref 5–15)
BUN: 12 mg/dL (ref 6–20)
CO2: 25 mmol/L (ref 22–32)
Calcium: 9.5 mg/dL (ref 8.9–10.3)
Chloride: 103 mmol/L (ref 98–111)
Creatinine, Ser: 1.74 mg/dL — ABNORMAL HIGH (ref 0.61–1.24)
GFR, Estimated: 52 mL/min — ABNORMAL LOW (ref >60)
Glucose, Bld: 97 mg/dL (ref 70–99)
Potassium: 3.4 mmol/L — ABNORMAL LOW (ref 3.5–5.1)
Sodium: 138 mmol/L (ref 135–145)
Total Bilirubin: 0.7 mg/dL (ref 0.0–1.2)
Total Protein: 7.4 g/dL (ref 6.5–8.1)

## 2024-01-08 LAB — URINALYSIS, W/ REFLEX TO CULTURE (INFECTION SUSPECTED)
Bacteria, UA: NONE SEEN
Bilirubin Urine: NEGATIVE
Glucose, UA: NEGATIVE mg/dL
Hgb urine dipstick: NEGATIVE
Ketones, ur: NEGATIVE mg/dL
Leukocytes,Ua: NEGATIVE
Nitrite: NEGATIVE
Protein, ur: 100 mg/dL — AB
Specific Gravity, Urine: 1.017 (ref 1.005–1.030)
pH: 6 (ref 5.0–8.0)

## 2024-01-08 LAB — RESPIRATORY PANEL BY PCR

## 2024-01-08 LAB — TROPONIN I (HIGH SENSITIVITY)
Troponin I (High Sensitivity): 15 ng/L (ref <18)
Troponin I (High Sensitivity): 18 ng/L — ABNORMAL HIGH (ref <18)

## 2024-01-08 LAB — D-DIMER, QUANTITATIVE: D-Dimer, Quant: <0.27 ug{FEU}/mL (ref 0.00–0.50)

## 2024-01-08 LAB — LIPASE, BLOOD: Lipase: 27 U/L (ref 11–51)

## 2024-01-08 LAB — TSH: TSH: 0.735 u[IU]/mL (ref 0.350–4.500)

## 2024-01-08 MED ORDER — RIVAROXABAN 10 MG PO TABS
10.0000 mg | ORAL_TABLET | Freq: Every day | ORAL | Status: DC
  Administered 2024-01-08 – 2024-01-09 (×2): 10 mg via ORAL
  Filled 2024-01-08 (×2): qty 1

## 2024-01-08 MED ORDER — VANCOMYCIN HCL 1500 MG/300ML IV SOLN: 1500.0000 mg | INTRAVENOUS | Status: DC

## 2024-01-08 MED ORDER — AMLODIPINE BESYLATE 10 MG PO TABS
10.0000 mg | ORAL_TABLET | Freq: Every day | ORAL | Status: DC
  Administered 2024-01-08 – 2024-01-09 (×2): 10 mg via ORAL
  Filled 2024-01-08 (×2): qty 1

## 2024-01-08 MED ORDER — POTASSIUM CHLORIDE CRYS ER 20 MEQ PO TBCR
40.0000 meq | EXTENDED_RELEASE_TABLET | Freq: Once | ORAL | Status: AC
  Administered 2024-01-08: 40 meq via ORAL
  Filled 2024-01-08: qty 2

## 2024-01-08 MED ORDER — CARVEDILOL 3.125 MG PO TABS
3.1250 mg | ORAL_TABLET | Freq: Two times a day (BID) | ORAL | Status: DC
  Administered 2024-01-08 – 2024-01-09 (×2): 3.125 mg via ORAL
  Filled 2024-01-08 (×2): qty 1

## 2024-01-08 MED ORDER — ACETAMINOPHEN 325 MG PO TABS
650.0000 mg | ORAL_TABLET | Freq: Four times a day (QID) | ORAL | Status: DC | PRN
  Administered 2024-01-08: 650 mg via ORAL
  Filled 2024-01-08: qty 2

## 2024-01-08 MED ORDER — SODIUM CHLORIDE 0.9 % IV SOLN
2.0000 g | INTRAVENOUS | Status: DC
Start: 2024-01-08 — End: 2024-01-08
  Filled 2024-01-08: qty 20

## 2024-01-08 MED ORDER — LACTATED RINGERS IV SOLN: INTRAVENOUS | Status: AC

## 2024-01-08 MED ORDER — LIDOCAINE 5 % EX PTCH
1.0000 | MEDICATED_PATCH | CUTANEOUS | Status: DC
  Administered 2024-01-08: 1 via TRANSDERMAL
  Filled 2024-01-08: qty 1

## 2024-01-08 MED ORDER — ACETAMINOPHEN 500 MG PO TABS
1000.0000 mg | ORAL_TABLET | Freq: Three times a day (TID) | ORAL | Status: DC | PRN
  Administered 2024-01-08 – 2024-01-09 (×2): 1000 mg via ORAL
  Filled 2024-01-08 (×2): qty 2

## 2024-01-08 MED ORDER — LABETALOL HCL 5 MG/ML IV SOLN
10.0000 mg | Freq: Once | INTRAVENOUS | Status: AC
  Administered 2024-01-08: 10 mg via INTRAVENOUS
  Filled 2024-01-08: qty 4

## 2024-01-08 MED ORDER — MAGNESIUM SULFATE 2 GM/50ML IV SOLN
2.0000 g | Freq: Once | INTRAVENOUS | Status: AC
  Administered 2024-01-08: 2 g via INTRAVENOUS
  Filled 2024-01-08: qty 50

## 2024-01-08 MED ORDER — IRBESARTAN 300 MG PO TABS
300.0000 mg | ORAL_TABLET | Freq: Every day | ORAL | Status: DC
  Filled 2024-01-08: qty 1

## 2024-01-08 MED ORDER — AMLODIPINE BESYLATE-VALSARTAN 10-320 MG PO TABS: 1.0000 | ORAL_TABLET | Freq: Every day | ORAL | Status: DC

## 2024-01-08 MED ORDER — VANCOMYCIN HCL 2000 MG/400ML IV SOLN
2000.0000 mg | Freq: Once | INTRAVENOUS | Status: DC
  Filled 2024-01-08: qty 400

## 2024-01-08 MED ORDER — FENTANYL CITRATE PF 50 MCG/ML IJ SOSY
50.0000 ug | PREFILLED_SYRINGE | Freq: Once | INTRAMUSCULAR | Status: DC
  Filled 2024-01-08: qty 1

## 2024-01-08 MED ORDER — HYDRALAZINE HCL 20 MG/ML IJ SOLN
10.0000 mg | Freq: Four times a day (QID) | INTRAMUSCULAR | Status: DC | PRN
  Administered 2024-01-08: 10 mg via INTRAVENOUS
  Filled 2024-01-08: qty 1

## 2024-01-08 MED ORDER — POLYETHYLENE GLYCOL 3350 17 G PO PACK
17.0000 g | PACK | Freq: Every day | ORAL | Status: DC
  Administered 2024-01-08 – 2024-01-09 (×2): 17 g via ORAL
  Filled 2024-01-08 (×2): qty 1

## 2024-01-08 MED ORDER — ACETAMINOPHEN 650 MG RE SUPP: 650.0000 mg | Freq: Four times a day (QID) | RECTAL | Status: DC | PRN

## 2024-01-08 MED ORDER — DULOXETINE HCL 60 MG PO CPEP
60.0000 mg | ORAL_CAPSULE | Freq: Every day | ORAL | Status: DC
  Administered 2024-01-09: 60 mg via ORAL
  Filled 2024-01-08: qty 1

## 2024-01-08 MED ORDER — CARBAMAZEPINE ER 100 MG PO TB12
300.0000 mg | ORAL_TABLET | Freq: Every day | ORAL | Status: DC
  Administered 2024-01-08: 300 mg via ORAL
  Filled 2024-01-08 (×2): qty 3

## 2024-01-08 MED ORDER — OSELTAMIVIR PHOSPHATE 75 MG PO CAPS
75.0000 mg | ORAL_CAPSULE | Freq: Two times a day (BID) | ORAL | Status: DC
  Administered 2024-01-08 – 2024-01-09 (×2): 75 mg via ORAL
  Filled 2024-01-08 (×3): qty 1

## 2024-01-08 MED ORDER — LACTATED RINGERS IV BOLUS: 1000.0000 mL | Freq: Once | INTRAVENOUS | Status: DC

## 2024-01-08 NOTE — Plan of Care (Signed)

## 2024-01-08 NOTE — ED Notes (Signed)
Patient left the floor in stable condition with his belongings and staff.

## 2024-01-08 NOTE — ED Triage Notes (Signed)
 Pt has had lower sternum chest pain that started a few days ago. Pt says it feels like constant inflammation as if he has been working out but haven't.   In the past pt had a similar feeling d/t low potassium.   Pt states he has experienced back pain and sob.

## 2024-01-08 NOTE — ED Provider Notes (Signed)
 [place] EMERGENCY DEPARTMENT AT [facility] Provider Note   CSN: [id number] Arrival date & time: 01/08/24  0901     History  Chief Complaint  Patient presents with   Chest Pain    Paul Jarvis is a 33 y.o. male.   Chest Pain Presents the chest.  Has had for last few days.  Is in the lower chest and in the upper abdomen.  Potentially better after eating tuna salad.  States worse with lying down.  States he has had it before when his potassium is been low.  Does have a history of hypertension.  States he did take his blood pressure medicines last night.  States has had some mild constipation.  No fevers.     Home Medications Prior to Admission medications   Medication Sig Start Date End Date Taking? Authorizing Provider  acetaminophen (TYLENOL) 500 MG tablet Take 1,000 mg by mouth every 6 (six) hours as needed for mild pain.    [provider]  amLODipine-valsartan (EXFORGE) 10-320 MG tablet Take 1 tablet by mouth daily. 11/13/23   Mercie Eon, MD  carvedilol (COREG) 3.125 MG tablet TAKE 1 TABLET(3.125 MG) BY MOUTH TWICE DAILY 11/07/23   Quintella Reichert, MD  DULoxetine (CYMBALTA) 60 MG capsule Take 1 capsule (60 mg total) by mouth daily. 06/24/23   Tyson Alias, MD  Meloxicam 15 MG TBDP Take 15 mg by mouth daily as needed. 04/29/23   Tyson Alias, MD  Omega 3 1000 MG CAPS Take 1,000 mg by mouth daily.    [provider]  OVER THE COUNTER MEDICATION Take 2 tablets by mouth daily. CKLS   ITS for Colon,kidney, liver, spleen supplement    [provider]  spironolactone (ALDACTONE) 25 MG tablet Take 1 tablet (25 mg total) by mouth daily. Please get labs checked in clinic before next refill 11/13/23   Mercie Eon, MD  Turmeric (QC TUMERIC COMPLEX PO) Take 2 capsules by mouth daily. Tumeric & Ginger supplement    [provider]      Allergies    Patient has no known allergies.    Review of Systems   Review of Systems   Cardiovascular:  Positive for chest pain.    Physical Exam Updated Vital Signs BP (!) 183/116   Pulse (!) 111   Temp 99.5 F (37.5 C) (Oral)   Resp 16   Ht 5\' 9"  (1.753 m)   Wt 117.9 kg   SpO2 99%   BMI 38.40 kg/m  Physical Exam Vitals and nursing note reviewed.  HENT:     Head: Atraumatic.  Cardiovascular:     Rate and Rhythm: Normal rate and regular rhythm.  Pulmonary:     Breath sounds: No wheezing.  Chest:     Comments: Mild tenderness to anterior chest wall.  No crepitance. Abdominal:     Tenderness: There is abdominal tenderness.     Comments: Mild tenderness to upper abdomen and left upper quadrant and right upper quadrant.  Also some epigastric tenderness.  No rebound or guarding.  No hernia palpated.  Neurological:     Mental Status: He is alert.     ED Results / Procedures / Treatments   Labs (all labs ordered are listed, but only abnormal results are displayed) Labs Reviewed  COMPREHENSIVE METABOLIC PANEL - Abnormal; Notable for the following components:      Result Value   Potassium 3.4 (*)    Creatinine, Ser 1.74 (*)  GFR, Estimated 52 (*)    All other components within normal limits  CBC WITH DIFFERENTIAL/PLATELET - Abnormal; Notable for the following components:   Lymphs Abs 0.5 (*)    All other components within normal limits  URINALYSIS, W/ REFLEX TO CULTURE (INFECTION SUSPECTED) - Abnormal; Notable for the following components:   Protein, ur 100 (*)    All other components within normal limits  LIPASE, BLOOD  TROPONIN I (HIGH SENSITIVITY)  TROPONIN I (HIGH SENSITIVITY)    EKG None  Radiology DG Chest Portable 1 View Result Date: 01/08/2024 CLINICAL DATA:  Chest pain, shortness of breath EXAM: PORTABLE CHEST 1 VIEW COMPARISON:  08/22/2022 FINDINGS: The heart size and mediastinal contours are within normal limits. Both lungs are clear. The visualized skeletal structures are unremarkable. IMPRESSION: Normal study. Electronically Signed    By: Charlett Nose M.D.   On: 01/08/2024 10:31    Procedures Procedures    Medications Ordered in ED Medications - No data to display  ED Course/ Medical Decision Making/ A&P                                 Medical Decision Making Amount and/or Complexity of Data Reviewed Labs: ordered. Radiology: ordered.   Patient chest pain abdominal pain.  Reviewed previous cardiac note.  Differential diagnosis along with does include cause such as cardiac cause, chest wall pain.  Also cause such as GERD and gastritis.  Biliary disease and pancreatic disease considered.    LFTs normal.  Doubt severe biliary disease.  Troponin is normal. Urinalysis  shows protein.  Creatinine now up to 1.7 from 1.5 a month ago and 1.4 a year ago.   with elevated blood pressure worsening kidney function does worry about acute endorgan damage with hypertension.  Will discuss with internal medicine for potential admission.   CRITICAL CARE Performed by: Benjiman Core Total critical care time: 30 minutes Critical care time was exclusive of separately billable procedures and treating other patients. Critical care was necessary to treat or prevent imminent or life-threatening deterioration. Critical care was time spent personally by me on the following activities: development of treatment plan with patient and/or surrogate as well as nursing, discussions with consultants, evaluation of patient's response to treatment, examination of patient, obtaining history from patient or surrogate, ordering and performing treatments and interventions, ordering and review of laboratory studies, ordering and review of radiographic studies, pulse oximetry and re-evaluation of patient's condition.        Final Clinical Impression(s) / ED Diagnoses Final diagnoses:  Nonspecific chest pain  Epigastric pain  Hypertension, unspecified type    Rx / DC Orders ED Discharge Orders     None         Benjiman Core,  MD 01/08/24 1156
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
## 2024-01-08 NOTE — Progress Notes (Signed)
 Pharmacy Antibiotic Note  Paul Jarvis is a 33 y.o. male admitted on 01/08/2024 with bacteremia.  Pharmacy has been consulted for Vancomycin / Ceftriaxone dosing.  Plan: Ceftriaxone 2 grams iv Q 24 hours Vancomycin 2 grams iv x 1 dose now then 1500 mg iv Q 24 hours (AUC of 470 using Scr 1.74, Vd coeff of 0.5)  Follow Scr, cultures, progress  Height: 5\' 9"  (175.3 cm) Weight: 117.9 kg (260 lb) IBW/kg (Calculated) : 70.7  Temp (24hrs), Avg:101.7 F (38.7 C), Min:99.5 F (37.5 C), Max:102.9 F (39.4 C)  Recent Labs  Lab 01/08/24 0917  WBC 5.6  CREATININE 1.74*    Estimated Creatinine Clearance: 75.8 mL/min (A) (by C-G formula based on SCr of 1.74 mg/dL (H)).    No Known Allergies    Thank you for allowing pharmacy to be a part of this patient's care.  Elwin Sleight 01/08/2024 3:46 PM
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
## 2024-01-09 ENCOUNTER — Telehealth (HOSPITAL_COMMUNITY): Payer: Self-pay | Admitting: Pharmacy Technician

## 2024-01-09 ENCOUNTER — Other Ambulatory Visit (HOSPITAL_COMMUNITY): Payer: Self-pay

## 2024-01-09 DIAGNOSIS — J101 Influenza due to other identified influenza virus with other respiratory manifestations: Secondary | ICD-10-CM | POA: Diagnosis not present

## 2024-01-09 DIAGNOSIS — R809 Proteinuria, unspecified: Secondary | ICD-10-CM | POA: Insufficient documentation

## 2024-01-09 DIAGNOSIS — N289 Disorder of kidney and ureter, unspecified: Secondary | ICD-10-CM

## 2024-01-09 LAB — LIPID PANEL
Cholesterol: 200 mg/dL (ref 0–200)
HDL: 31 mg/dL — ABNORMAL LOW (ref >40)
LDL Cholesterol: 141 mg/dL — ABNORMAL HIGH (ref 0–99)
Total CHOL/HDL Ratio: 6.5 ratio
Triglycerides: 141 mg/dL (ref <150)
VLDL: 28 mg/dL (ref 0–40)

## 2024-01-09 LAB — RAPID URINE DRUG SCREEN, HOSP PERFORMED
Amphetamines: NOT DETECTED
Barbiturates: NOT DETECTED
Benzodiazepines: NOT DETECTED
Cocaine: NOT DETECTED
Opiates: NOT DETECTED
Tetrahydrocannabinol: NOT DETECTED

## 2024-01-09 LAB — BLOOD CULTURE ID PANEL (REFLEXED) - BCID2

## 2024-01-09 LAB — CBC
HCT: 38.8 % — ABNORMAL LOW (ref 39.0–52.0)
Hemoglobin: 12.9 g/dL — ABNORMAL LOW (ref 13.0–17.0)
MCH: 27.4 pg (ref 26.0–34.0)
MCHC: 33.2 g/dL (ref 30.0–36.0)
MCV: 82.6 fL (ref 80.0–100.0)
Platelets: 244 10*3/uL (ref 150–400)
RBC: 4.7 MIL/uL (ref 4.22–5.81)
RDW: 14.6 % (ref 11.5–15.5)
WBC: 4.9 10*3/uL (ref 4.0–10.5)
nRBC: 0 % (ref 0.0–0.2)

## 2024-01-09 LAB — BASIC METABOLIC PANEL WITH GFR
Anion gap: 8 (ref 5–15)
BUN: 13 mg/dL (ref 6–20)
CO2: 26 mmol/L (ref 22–32)
Calcium: 8.9 mg/dL (ref 8.9–10.3)
Chloride: 105 mmol/L (ref 98–111)
Creatinine, Ser: 1.8 mg/dL — ABNORMAL HIGH (ref 0.61–1.24)
GFR, Estimated: 50 mL/min — ABNORMAL LOW (ref >60)
Glucose, Bld: 103 mg/dL — ABNORMAL HIGH (ref 70–99)
Potassium: 3.6 mmol/L (ref 3.5–5.1)
Sodium: 139 mmol/L (ref 135–145)

## 2024-01-09 MED ORDER — OSELTAMIVIR PHOSPHATE 75 MG PO CAPS
75.0000 mg | ORAL_CAPSULE | Freq: Two times a day (BID) | ORAL | 0 refills | Status: AC
Start: 2024-01-09 — End: 2024-01-13
  Filled 2024-01-09: qty 8, 4d supply, fill #0

## 2024-01-09 MED ORDER — IRBESARTAN 300 MG PO TABS
300.0000 mg | ORAL_TABLET | Freq: Once | ORAL | Status: AC
  Administered 2024-01-09: 300 mg via ORAL
  Filled 2024-01-09: qty 1

## 2024-01-09 MED ORDER — LIDOCAINE 5 % EX PTCH
1.0000 | MEDICATED_PATCH | CUTANEOUS | 0 refills | Status: AC
  Filled 2024-01-09 (×2): qty 30, 30d supply, fill #0

## 2024-01-09 MED ORDER — METHOCARBAMOL 750 MG PO TABS
1500.0000 mg | ORAL_TABLET | Freq: Three times a day (TID) | ORAL | 0 refills | Status: AC | PRN
  Filled 2024-01-09: qty 120, 20d supply, fill #0

## 2024-01-09 NOTE — Discharge Summary (Addendum)
 Name: Paul Jarvis MRN: [id number] DOB: Feb 12, 1991 33 y.o. PCP: Quintella Reichert, MD  Date of Admission: 01/08/2024  9:03 AM Date of Discharge:  01/09/2024 Attending Physician: Dr. Antony Contras  DISCHARGE DIAGNOSIS:  Primary Problem: Influenza A   Hospital Problems: Principal Problem:   Influenza A Active Problems:   Bipolar depression (HCC)   Hyperlipidemia   Fibromyalgia syndrome   Obstructive sleep apnea   Severe hypertension   Renal dysfunction   Proteinuria    DISCHARGE MEDICATIONS:   Allergies as of 01/09/2024   No Known Allergies      Medication List     TAKE these medications    acetaminophen 500 MG tablet Commonly known as: TYLENOL Take 1,000 mg by mouth every 6 (six) hours as needed for mild pain.   amLODipine-valsartan 10-320 MG tablet Commonly known as: EXFORGE Take 1 tablet by mouth daily.   carbamazepine 300 MG 12 hr capsule Commonly known as: CARBATROL Take 300 mg by mouth at bedtime.   carvedilol 3.125 MG tablet Commonly known as: COREG TAKE 1 TABLET(3.125 MG) BY MOUTH TWICE DAILY What changed: See the new instructions.   DULoxetine 60 MG capsule Commonly known as: CYMBALTA Take 1 capsule (60 mg total) by mouth daily.   lidocaine 5 % Commonly known as: LIDODERM Place 1 patch onto the skin daily. Remove & Discard patch within 12 hours or as directed by MD   Meloxicam 15 MG Tbdp Take 15 mg by mouth daily as needed. What changed: reasons to take this   methocarbamol 750 MG tablet Commonly known as: Robaxin-750 Take 2 tablets (1,500 mg total) by mouth 3 (three) times daily as needed for up to 20 days for muscle spasms.   Omega 3 1000 MG Caps Take 1,000 mg by mouth daily.   oseltamivir 75 MG capsule Commonly known as: TAMIFLU Take 1 capsule (75 mg total) by mouth 2 (two) times daily for 8 doses.   OVER THE COUNTER MEDICATION Take 2 tablets by mouth daily. CKLS   ITS for Colon,kidney, liver, spleen supplement   QC TUMERIC COMPLEX  PO Take 2 capsules by mouth daily. Tumeric & Ginger supplement   spironolactone 25 MG tablet Commonly known as: ALDACTONE Take 1 tablet (25 mg total) by mouth daily. Please get labs checked in clinic before next refill        DISPOSITION AND FOLLOW-UP:  Paul Jarvis was discharged from [facility] in Good condition. At the hospital follow up visit please address:  Influenza A: Discharged on Tamiflu for 4 more days. Ensure resolution of symptoms.   Severe Hypertension: BP quite elevated on presentation, appears to be chronically elevated despite medications. Recheck BP and consider further workup and additional medication management. Recheck BMP to assess kidney function.  OSA: has not been wearing BiPaP, follow up on this outpatient. May benefit from Zepbound for OSA and weight loss.   Hypokalemia: Mild hypokalemia on presentation.  Recommend rechecking BMP outpatient to evaluate potassium and creatinine.   Follow-up Appointments:  Follow-up Information     Katheran James, DO Follow up on 01/14/2024.   Specialty: Internal Medicine Why: Please attend your hospital follow up appointment on the above date at 10:45 AM. Contact information: [address] [place] [address] [phone]                HOSPITAL COURSE:  Patient Summary: Severe symptomatic hypertension He presented with blood pressure significantly elevated at 183/116.  It remained elevated throughout his hospital course.  He is on several medications for this, but per chart review looks like his blood pressure has remained elevated despite these medications.  He was given hydralazine as needed to manage his blood pressure in the hospital in addition to his home medications.  He had shortness of breath and some chest pain, as well as a bump in his creatinine, which were thought to possibly be related to his severe hypertension.  OSA with inconsistent CPAP use may also be a contributing  factor.  On the day of discharge, his chest pain has largely resolved, he does not have a headache or vision changes, and his shortness of breath is improved.  He will be discharged on his home blood pressure medications with close outpatient follow-up for this.  Atypical Chest pain Left-sided back pain He presented with chest and epigastric tightness which was tenderness to palpation.  Cardiac workup was done.  EKG showed sinus tachycardia.  Troponin trended from 15-18.  Chest pain was atypical, unlikely cardiogenic.  Per chart review, it is similar to prior episodes of intermittent chest pain.  The patient says he has this kind of chest pain once quarterly.  It worsens with sitting and improves with exertion.  It may be related to his fibromyalgia as well.  On the day of discharge, his chest pain had largely resolved and he is feeling much better.  He has some tenderness to palpation and tightness along the muscles on his left back and flank, which is also likely musculoskeletal.  He has been discharged with Robaxin and lidocaine patches   Influenza A EKG with sinus tachycardia in the ED. HR sustained in the 120s. Initially afebrile, but spiked a fever of 102.7. UA negative. Initially drew blood cultures and started antibiotics for concerns of sepsis, but viral panel came back positive for flu A, so antibiotics were discontinued.  Blood cultures remain pending.  On the day of discharge, he remained afebrile and his symptoms have improved.  He was discharged on Tamiflu for 4 more days.  Hypokalemia Potassium 3.4. Repleted with 40 mEq potassium.   Anxiety/depression Bipolar II disorder Fibromyalgia Has anxiety attacks and fibromyalgia exacerbations occasionally. He says he has been under a lot of stress recently, which may be contributing. Currently treated with duloxetine 60 mg daily and prn meloxicam, which he only takes occasionally. Follows with a psychiatrist who prescribes these medications.   Continued his current medications and recommended outpatient follow-up     DISCHARGE INSTRUCTIONS:   Discharge Instructions     Call MD for:  difficulty breathing, headache or visual disturbances   Complete by: As directed    Call MD for:  extreme fatigue   Complete by: As directed    Call MD for:  persistant dizziness or light-headedness   Complete by: As directed    Call MD for:  persistant nausea and vomiting   Complete by: As directed    Call MD for:  severe uncontrolled pain   Complete by: As directed    Diet - low sodium heart healthy   Complete by: As directed    Discharge instructions   Complete by: As directed    You were hospitalized for severe hypertension and chest pain, and were found to have influenza A.  You have been treated with Tamiflu and your symptoms have improved. Thank you for allowing [place] to be part of your care.   We arranged for you to follow up at the Internal Medicine Clinic on 01/14/2024 at 10:45 AM  with Dr. Ninfa Meeker.   Please note these changes made to your medications:   Please START taking:   Tamiflu twice a day for the next 4 days to treat your influenza Robaxin 1500 mg (2 tablets) up to 3 times daily for muscle pain and spasms Lidocaine patches as needed applied to areas of muscle pain  You can continue all your other medications including your home blood pressure medications.  Your primary care doctor can discuss these medications with you at your follow-up appointment and adjust them to better control your blood pressure.    As we discussed, obstructive sleep apnea can cause your blood pressure to remain quite high, so it is important for you to use your CPAP as much as you are able to. There is also a medication called Zepbound which may be a good option for you, as it helps with sleep apnea and weight loss, but I would discuss this with your PCP.   If you experience any sudden worsening of chest pain, lightheadedness or dizziness, lack of  improvement of your symptoms over the next week or two, please return to emergency department for evaluation.  Otherwise please go to primary care doctor for further outpatient management.  Please call our clinic if you have any questions or concerns, we may be able to help and keep you from a long and expensive emergency room wait. Our clinic and after hours phone number is [phone], the best time to call is Monday through Friday 9 am to 4 pm but there is always someone available 24/7 if you have an emergency. If you need medication refills please notify your pharmacy one week in advance and they will send [place] a request.   Increase activity slowly   Complete by: As directed        SUBJECTIVE:   Feels better this morning. Chest pain largely resolved, still has left-sided muscular pain, likely MSK etiology from my exam. Ready for discharge.   Discharge Vitals:   BP (!) 178/108 (BP Location: Right Arm)   Pulse 95   Temp 98.7 F (37.1 C) (Oral)   Resp 18   Ht 5\' 9"  (1.753 m)   Wt 117.9 kg   SpO2 100%   BMI 38.40 kg/m   OBJECTIVE:  Physical Exam Constitutional:      Appearance: He is well-developed.  HENT:     Head: Normocephalic and atraumatic.  Eyes:     Extraocular Movements: Extraocular movements intact.     Pupils: Pupils are equal, round, and reactive to light.  Cardiovascular:     Rate and Rhythm: Normal rate and regular rhythm.     Pulses: Normal pulses.     Heart sounds: Normal heart sounds.  Pulmonary:     Effort: Pulmonary effort is normal.  Abdominal:     General: Abdomen is flat.     Palpations: Abdomen is soft.  Musculoskeletal:        General: Tenderness present. Normal range of motion.     Cervical back: Normal range of motion.     Comments: Diffuse muscular tenderness of the left back, mild chest tenderness to palpation  Skin:    General: Skin is warm and dry.  Neurological:     General: No focal deficit present.     Mental Status: He is alert and  oriented to person, place, and time.  Psychiatric:        Mood and Affect: Mood normal.        Behavior: Behavior  normal.     Pertinent Labs, Studies, and Procedures:     Latest Ref Rng & Units 01/09/2024    8:12 AM 01/08/2024    9:17 AM 08/22/2022    7:05 AM  CBC  WBC 4.0 - 10.5 K/uL 4.9  5.6  5.0   Hemoglobin 13.0 - 17.0 g/dL 57.8  46.9  62.9   Hematocrit 39.0 - 52.0 % 38.8  44.6  41.5   Platelets 150 - 400 K/uL 244  305  387       Latest Ref Rng & Units 01/09/2024    8:12 AM 01/08/2024    9:17 AM 11/15/2023    8:54 AM  CMP  Glucose 70 - 99 mg/dL 528  97  413   BUN 6 - 20 mg/dL 13  12  14    Creatinine 0.61 - 1.24 mg/dL 2.44  0.10  2.72   Sodium 135 - 145 mmol/L 139  138  141   Potassium 3.5 - 5.1 mmol/L 3.6  3.4  4.3   Chloride 98 - 111 mmol/L 105  103  103   CO2 22 - 32 mmol/L 26  25  22    Calcium 8.9 - 10.3 mg/dL 8.9  9.5  9.0   Total Protein 6.5 - 8.1 g/dL  7.4    Total Bilirubin 0.0 - 1.2 mg/dL  0.7    Alkaline Phos 38 - 126 U/L  85    AST 15 - 41 U/L  24    ALT 0 - 44 U/L  19      DG Chest Portable 1 View Result Date: 01/08/2024 IMPRESSION: Normal study. Electronically Signed   By: Charlett Nose M.D.   On: 01/08/2024 10:31     Signed: Annett Fabian, MD Internal Medicine Resident, PGY-1 [facility]  Pager: [phone] 11:37 AM, 01/09/2024

## 2024-01-09 NOTE — Plan of Care (Signed)

## 2024-01-09 NOTE — Progress Notes (Signed)
 PHARMACY - PHYSICIAN COMMUNICATION CRITICAL VALUE ALERT - BLOOD CULTURE IDENTIFICATION (BCID)  Paul Jarvis is an 33 y.o. male who [facility] to [facility] with a chief complaint of severe hypertension and shortness of breath/chest pain in the setting of Influenza A.   Assessment: 1/4 bottles; BCID staph species, strep species. Possible contaminant (SOB resolved in the setting of Influenza A treatment)  Name of physician (or Provider) Contacted: Dr. Reymundo Poll   Current antibiotics: none  Changes to prescribed antibiotics recommended:   Patient is not on any antibiotics; likely contaminant therefore no changes   Results for orders placed or performed during the hospital encounter of 01/08/24  Blood Culture ID Panel (Reflexed) (Collected: 01/08/2024  4:27 PM)  Result Value Ref Range   Enterococcus faecalis NOT DETECTED NOT DETECTED   Enterococcus Faecium NOT DETECTED NOT DETECTED   Listeria monocytogenes NOT DETECTED NOT DETECTED   Staphylococcus species DETECTED (A) NOT DETECTED   Staphylococcus aureus (BCID) NOT DETECTED NOT DETECTED   Staphylococcus epidermidis NOT DETECTED NOT DETECTED   Staphylococcus lugdunensis NOT DETECTED NOT DETECTED   Streptococcus species DETECTED (A) NOT DETECTED   Streptococcus agalactiae NOT DETECTED NOT DETECTED   Streptococcus pneumoniae NOT DETECTED NOT DETECTED   Streptococcus pyogenes NOT DETECTED NOT DETECTED   A.calcoaceticus-baumannii NOT DETECTED NOT DETECTED   Bacteroides fragilis NOT DETECTED NOT DETECTED   Enterobacterales NOT DETECTED NOT DETECTED   Enterobacter cloacae complex NOT DETECTED NOT DETECTED   Escherichia coli NOT DETECTED NOT DETECTED   Klebsiella aerogenes NOT DETECTED NOT DETECTED   Klebsiella oxytoca NOT DETECTED NOT DETECTED   Klebsiella pneumoniae NOT DETECTED NOT DETECTED   Proteus species NOT DETECTED NOT DETECTED   Salmonella species NOT DETECTED NOT DETECTED   Serratia marcescens NOT DETECTED NOT  DETECTED   Haemophilus influenzae NOT DETECTED NOT DETECTED   Neisseria meningitidis NOT DETECTED NOT DETECTED   Pseudomonas aeruginosa NOT DETECTED NOT DETECTED   Stenotrophomonas maltophilia NOT DETECTED NOT DETECTED   Candida albicans NOT DETECTED NOT DETECTED   Candida auris NOT DETECTED NOT DETECTED   Candida glabrata NOT DETECTED NOT DETECTED   Candida krusei NOT DETECTED NOT DETECTED   Candida parapsilosis NOT DETECTED NOT DETECTED   Candida tropicalis NOT DETECTED NOT DETECTED   Cryptococcus neoformans/gattii NOT DETECTED NOT DETECTED   Please reach out with any questions or concerns,   Milford Cage 01/09/2024  12:31 PM

## 2024-01-09 NOTE — Telephone Encounter (Signed)
 Pharmacy Patient Advocate Encounter  Received notification from [facility] Medicaid that Prior Authorization for Lidocaine 5% patches  has been APPROVED from 01/09/2024 to 01/08/2025   PA #/Case ID/Reference #: [id number]

## 2024-01-09 NOTE — Telephone Encounter (Signed)
 Pharmacy Patient Advocate Encounter   Received notification from Inpatient Request that prior authorization for Lidocaine 5% patches is required/requested.   Insurance verification completed.   The patient is insured through [facility] [place] .   Per test claim: PA required; PA submitted to above mentioned insurance via CoverMyMeds Key/confirmation #/[facility] Status is pending

## 2024-01-09 NOTE — Plan of Care (Signed)
 Discharge instructions discussed with patient.  Patient instructed on home medications, restrictions, and follow up appointments. Belongings gathered and sent with patient.  Patients medications to be picked up at [facility] pharmacy.

## 2024-01-12 LAB — CULTURE, BLOOD (ROUTINE X 2): Special Requests: ADEQUATE

## 2024-01-14 ENCOUNTER — Encounter: Admitting: Student

## 2024-01-17 LAB — CULTURE, BLOOD (ROUTINE X 2)

## 2024-01-20 ENCOUNTER — Telehealth: Payer: Self-pay

## 2024-01-20 ENCOUNTER — Ambulatory Visit (INDEPENDENT_AMBULATORY_CARE_PROVIDER_SITE_OTHER): Admitting: Student

## 2024-01-20 VITALS — BP 154/93 | HR 79 | Temp 97.5°F | Ht 69.0 in | Wt 249.8 lb

## 2024-01-20 DIAGNOSIS — N289 Disorder of kidney and ureter, unspecified: Secondary | ICD-10-CM | POA: Diagnosis not present

## 2024-01-20 DIAGNOSIS — M79602 Pain in left arm: Secondary | ICD-10-CM | POA: Diagnosis not present

## 2024-01-20 DIAGNOSIS — I10 Essential (primary) hypertension: Secondary | ICD-10-CM | POA: Diagnosis present

## 2024-01-20 DIAGNOSIS — G4733 Obstructive sleep apnea (adult) (pediatric): Secondary | ICD-10-CM | POA: Diagnosis not present

## 2024-01-20 DIAGNOSIS — F319 Bipolar disorder, unspecified: Secondary | ICD-10-CM | POA: Diagnosis not present

## 2024-01-20 MED ORDER — DAPAGLIFLOZIN PROPANEDIOL 10 MG PO TABS
10.0000 mg | ORAL_TABLET | Freq: Every day | ORAL | 3 refills | Status: AC
Start: 2024-01-20 — End: ?

## 2024-01-20 MED ORDER — CARVEDILOL 6.25 MG PO TABS: 6.2500 mg | ORAL_TABLET | Freq: Two times a day (BID) | ORAL | 1 refills | Status: DC

## 2024-01-20 NOTE — Patient Instructions (Addendum)
 Thank you, Mr.Antonios Hulen Luster for allowing [place] to provide your care today. Today we discussed your blood pressure, kidney function, labs, and next steps.   I have ordered the following labs for you:   Lab Orders         BMP8+Anion Gap         CBC with Diff         Urinalysis, Reflex Microscopic         Microalbumin / Creatinine Urine Ratio      Tests ordered today:  None  Referrals ordered today:   Referral Orders  No referral(s) requested today     I have ordered the following medication/changed the following medications:   Stop the following medications: Medications Discontinued During This Encounter  Medication Reason   carvedilol (COREG) 3.125 MG tablet Reorder     Start the following medications: Meds ordered this encounter  Medications   carvedilol (COREG) 6.25 MG tablet    Sig: Take 1 tablet (6.25 mg total) by mouth 2 (two) times daily with a meal.    Dispense:  60 tablet    Refill:  1    Patient needs a current Appointment with provider for future refills please call [phone] 1st Attempt   dapagliflozin propanediol (FARXIGA) 10 MG TABS tablet    Sig: Take 1 tablet (10 mg total) by mouth daily before breakfast.    Dispense:  30 tablet    Refill:  3     Follow up: 1 month    Remember:   - I will call you with the results of your labs  - They will call you to schedule the renal ultrasound  - Please try to wear your CPAP machine daily - They will call you regarding the nutritionist consult, please try to limit the amount of salt in your diet   CHANGES:   - Your Carvedilol has been increased to 6.25 mg twice a day  - You will start to take a medication called Farxiga 10 mg once a day, this medication helps to protect your kidneys - Please measure your blood pressure and keep a log that you can bring in at your follow up visit   - For your left arm/wrist pain:  - Please try to wear a wrist splint at night to keep your wrist straight, and ice  as needed. The pain may be due to inflammation. Please call us if pain worsens or you notice changes in arm strength/sensation.   Should you have any questions or concerns please call the internal medicine clinic at [phone].     Aliany Fiorenza Colbert Coyer, MD PGY-1 Internal Medicine Teaching Progam [facility]

## 2024-01-20 NOTE — Telephone Encounter (Signed)
 Prior Authorization for patient Paul Jarvis 10MG  tablets) came through on cover my meds was submitted with last office notes awaiting approval or denial.  [id number]

## 2024-01-21 LAB — MICROSCOPIC EXAMINATION
Bacteria, UA: NONE SEEN
Casts: NONE SEEN /LPF
RBC, Urine: NONE SEEN /HPF (ref 0–2)
WBC, UA: NONE SEEN /HPF (ref 0–5)

## 2024-01-21 LAB — BMP8+ANION GAP
Anion Gap: 15 mmol/L (ref 10.0–18.0)
BUN/Creatinine Ratio: 10 (ref 9–20)
BUN: 17 mg/dL (ref 6–20)
CO2: 22 mmol/L (ref 20–29)
Calcium: 9.4 mg/dL (ref 8.7–10.2)
Chloride: 100 mmol/L (ref 96–106)
Creatinine, Ser: 1.65 mg/dL — ABNORMAL HIGH (ref 0.76–1.27)
Glucose: 87 mg/dL (ref 70–99)
Potassium: 4.2 mmol/L (ref 3.5–5.2)
Sodium: 137 mmol/L (ref 134–144)
eGFR: 55 mL/min/{1.73_m2} — ABNORMAL LOW (ref >59)

## 2024-01-21 LAB — CBC WITH DIFFERENTIAL/PLATELET
Basophils Absolute: 0 10*3/uL (ref 0.0–0.2)
Basos: 0 %
EOS (ABSOLUTE): 0.1 10*3/uL (ref 0.0–0.4)
Eos: 2 %
Hematocrit: 42.1 % (ref 37.5–51.0)
Hemoglobin: 14.1 g/dL (ref 13.0–17.7)
Immature Grans (Abs): 0 10*3/uL (ref 0.0–0.1)
Immature Granulocytes: 0 %
Lymphocytes Absolute: 1.7 10*3/uL (ref 0.7–3.1)
Lymphs: 35 %
MCH: 27.3 pg (ref 26.6–33.0)
MCHC: 33.5 g/dL (ref 31.5–35.7)
MCV: 82 fL (ref 79–97)
Monocytes Absolute: 0.3 10*3/uL (ref 0.1–0.9)
Monocytes: 7 %
Neutrophils Absolute: 2.7 10*3/uL (ref 1.4–7.0)
Neutrophils: 56 %
Platelets: 354 10*3/uL (ref 150–450)
RBC: 5.16 x10E6/uL (ref 4.14–5.80)
RDW: 13.8 % (ref 11.6–15.4)
WBC: 4.8 10*3/uL (ref 3.4–10.8)

## 2024-01-21 LAB — URINALYSIS, ROUTINE W REFLEX MICROSCOPIC
Bilirubin, UA: NEGATIVE
Glucose, UA: NEGATIVE
Leukocytes,UA: NEGATIVE
Nitrite, UA: NEGATIVE
RBC, UA: NEGATIVE
Specific Gravity, UA: 1.026 (ref 1.005–1.030)
Urobilinogen, Ur: 0.2 mg/dL (ref 0.2–1.0)
pH, UA: 5.5 (ref 5.0–7.5)

## 2024-01-21 NOTE — Telephone Encounter (Signed)
 Name: Paul Jarvis MID: [id number] R Decision Date: 01/20/2024 [name] FOR: Service Description Code 1 Code 2 Plan Requested  Dates Requested  Amount FARXIGA Tablet [id number] Medicaid 01/20/2024 30 units WE DENIED: Service Description Code 1 Code 2 Plan Denied Dates Denied  Amount [name] Tablet [id number] Medicaid 01/20/2024 30 units COMMENTS:  The requested drug is not approved by the Food and Drug Administration (FDA) It is FDA approved for heart failure; type 2 diabetes  mellites; chronic kidney disease

## 2024-01-22 DIAGNOSIS — M79602 Pain in left arm: Secondary | ICD-10-CM | POA: Insufficient documentation

## 2024-01-22 NOTE — Assessment & Plan Note (Signed)
 Patient described having left arm pain after leaving the hospital. 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 Of note, blood pressure cuff and IV line were inserted on the left side. Discussed possibility of pain caused by BP cuff and IV line as well as carpal tunnel syndrome. Strength on upper extremities was 5/5 bilaterally, sensation intact. No obvious swelling in wrists. For now will treat conservatively with ice and wrist splint at night. Patient agreeable to plan. Will follow up in 1 month if pain persists or worsens.

## 2024-01-22 NOTE — Assessment & Plan Note (Signed)
 Patient with Cr of 1.8 at discharge. Discussed how HTN can contribute to kidney damage, and how kidney disease can contribute to elevated BP. Patient endorses good effort to stay well hydrated. Today's Cr slightly improved at 1.65, GFR 55. Given overall pattern of worsening kidney function (elevated Cr) over the last 7 months, discussed starting Farxiga for additional renal protection. Patient agreeable. Will also obtain renal ultrasound and urine labs today to help monitor proteinuria and electrolyte balance. Potassium has been low in the past, 4.2 today.  Plan - Follow up UA, micro/alb as needed - Follow up renal ultrasound - START Farxiga 10 mg daily  - Follow up in 1 month

## 2024-01-22 NOTE — Progress Notes (Signed)
 Internal Medicine Clinic Attending  Case discussed with the resident at the time of the visit.  We reviewed the resident's history and exam and pertinent patient test results.  I agree with the assessment, diagnosis, and plan of care documented in the resident's note.

## 2024-01-22 NOTE — Assessment & Plan Note (Addendum)
 Patient seen in the past by [facility] Health at [facility]. Has been managing his bipolar depression with Carbamezapine 300 mg at bedtime. Also on Duloxetine 60 mg primarily for fibromyalgia. Last note from [facility] from 2023. Per patient, he follows up with Dr. Charlestine Night, last seen for medication management in March and sees them about every 3 months. Feels like current medications are keeping his mood/anxiety under control. Feels stress from multiple jobs and family responsibilities. Has discussed adjusting regimen with Dr. Charlestine Night in the past due to concerns that they are contributing to elevated BP, but patient would like to keep current regimen as it is.  Plan - Continue following up with psychiatry - Continue Carbamezapine 300 mg at bedtime - Continue Duloxetine 60 mg daily  - CBC with diff today, follow up as needed
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
## 2024-01-22 NOTE — Assessment & Plan Note (Signed)
 Patient presented to [facility] with BP 183/116 with atypical chest pain, shortness of breath, and an elevated creatinine. Found to have influenza A and treated with Tamiflu. Prior to his hospitalization, the patient was on amlodipine-valsartan 10-320 mg daily, Carvedilol 3.125 mg BID, and spironolactone 25 mg daily for blood pressure control. Despite multiple antihypertensives, patients' blood pressures have remained persistently elevated in outpatient visits. No changes were made to his antihypertensives during his hospitalization.   Today the patient's BP is improved to 156/100 and 154/93 on repeat. He reports compliance with antihypertensive therapy. Denies any chest pain, headache, vision changes, or abdominal pain. Did have some abdominal upset over the weekend but believes it may be due to his mood medication. Will monitor. CV exam unremarkable today.  Per patient and chart review, the leading thought on what is causing persistent hypertension at his age is untreated OSA. Patient reports it is very difficult to wear his CPAP mask consistently at night as it makes it difficult to sleep and he has a busy evening schedule putting his children to bed. Discussed importance of adhering to CPAP. Patient's wife expressed concerns regarding his mood medications and whether they could be contributing to his BP. Although there are some reports of BP going up with Duloxetine and/or Carbamezapine therapy, it is unlikely that is is contributing to such elevated blood pressures. Aldosterone/renin levels have been checked in the past but have been WNL. Will obtain renal ultrasound as this has not been completed previously to help determine if there is renal stenosis that may be contributing to BP. Patient also agreeable to increase in Carvedilol dose since blood pressures remain elevated today.  Plan - Increase Carvedilol to 6.25 mg BID with meals - Continue amlodipine-valsartan 10-320 mg daily  - Continue spironolactone 25  mg daily  - F/u renal ultrasound  - BMP today  - Follow up in 1 month to review renal US, medication management, and determine if patient has been wearing his CPAP

## 2024-01-22 NOTE — Assessment & Plan Note (Addendum)
 Patient not wearing CPAP machine at night. Barriers include busy home life and comfort level. Discussed likely contribution to persistently elevated blood pressures. Patient seemed motivated to try his current CPAP and come back in 1 month to help determine if an alternative mask or plan is needed to help address his OSA (i.e. weight loss, BMI 36.89 kg/m2). Discussed possibility of using Wegovy, patient prefers starting with diet/nutrition modification. Agreeable to nutrition consult.  Plan - Patient to attempt CPAP wearing for next 4 weeks - Nutrition referral  - Consider CPAP mask modification or additional medication therapy to help with weight loss at follow up visit.

## 2024-01-26 ENCOUNTER — Other Ambulatory Visit: Payer: Self-pay

## 2024-01-26 ENCOUNTER — Emergency Department (HOSPITAL_COMMUNITY)
Admission: EM | Admit: 2024-01-26 | Discharge: 2024-01-27 | Disposition: A | Discharge status: Home / Self Care | Attending: Emergency Medicine | Admitting: Emergency Medicine

## 2024-01-26 ENCOUNTER — Emergency Department (HOSPITAL_COMMUNITY)

## 2024-01-26 ENCOUNTER — Encounter (HOSPITAL_COMMUNITY): Payer: Self-pay

## 2024-01-26 DIAGNOSIS — R519 Headache, unspecified: Secondary | ICD-10-CM | POA: Diagnosis not present

## 2024-01-26 DIAGNOSIS — R030 Elevated blood-pressure reading, without diagnosis of hypertension: Secondary | ICD-10-CM | POA: Insufficient documentation

## 2024-01-26 DIAGNOSIS — R944 Abnormal results of kidney function studies: Secondary | ICD-10-CM | POA: Diagnosis not present

## 2024-01-26 DIAGNOSIS — M549 Dorsalgia, unspecified: Secondary | ICD-10-CM | POA: Diagnosis present

## 2024-01-26 DIAGNOSIS — R0602 Shortness of breath: Secondary | ICD-10-CM | POA: Insufficient documentation

## 2024-01-26 LAB — CBC
HCT: 38.6 % — ABNORMAL LOW (ref 39.0–52.0)
Hemoglobin: 13.2 g/dL (ref 13.0–17.0)
MCH: 28.2 pg (ref 26.0–34.0)
MCHC: 34.2 g/dL (ref 30.0–36.0)
MCV: 82.5 fL (ref 80.0–100.0)
Platelets: 328 10*3/uL (ref 150–400)
RBC: 4.68 MIL/uL (ref 4.22–5.81)
RDW: 14.5 % (ref 11.5–15.5)
WBC: 6.4 10*3/uL (ref 4.0–10.5)
nRBC: 0 % (ref 0.0–0.2)

## 2024-01-26 LAB — BASIC METABOLIC PANEL WITH GFR
Anion gap: 8 (ref 5–15)
BUN: 14 mg/dL (ref 6–20)
CO2: 25 mmol/L (ref 22–32)
Calcium: 9.4 mg/dL (ref 8.9–10.3)
Chloride: 104 mmol/L (ref 98–111)
Creatinine, Ser: 1.65 mg/dL — ABNORMAL HIGH (ref 0.61–1.24)
GFR, Estimated: 55 mL/min — ABNORMAL LOW (ref >60)
Glucose, Bld: 101 mg/dL — ABNORMAL HIGH (ref 70–99)
Potassium: 3.6 mmol/L (ref 3.5–5.1)
Sodium: 137 mmol/L (ref 135–145)

## 2024-01-26 LAB — TROPONIN I (HIGH SENSITIVITY)
Troponin I (High Sensitivity): 12 ng/L (ref <18)
Troponin I (High Sensitivity): 11 ng/L (ref <18)

## 2024-01-26 NOTE — ED Triage Notes (Signed)
 Pt complaining of his blood pressure being high. Arnetta Lank he has a headache and shortness of breath and he feels like his back is tight. Started about an hour ago.

## 2024-01-27 ENCOUNTER — Ambulatory Visit (HOSPITAL_COMMUNITY): Admission: RE | Admit: 2024-01-27 | Source: Ambulatory Visit

## 2024-01-27 ENCOUNTER — Emergency Department (HOSPITAL_COMMUNITY)

## 2024-01-27 MED ORDER — LACTATED RINGERS IV BOLUS
1000.0000 mL | Freq: Once | INTRAVENOUS | Status: AC
Start: 2024-01-27 — End: 2024-01-27
  Administered 2024-01-27: 1000 mL via INTRAVENOUS

## 2024-01-27 MED ORDER — KETOROLAC TROMETHAMINE 30 MG/ML IJ SOLN
30.0000 mg | Freq: Once | INTRAMUSCULAR | Status: AC
  Administered 2024-01-27: 30 mg via INTRAVENOUS
  Filled 2024-01-27: qty 1

## 2024-01-27 MED ORDER — IOHEXOL 350 MG/ML SOLN
100.0000 mL | Freq: Once | INTRAVENOUS | Status: AC | PRN
  Administered 2024-01-27: 100 mL via INTRAVENOUS

## 2024-01-27 MED ORDER — METOCLOPRAMIDE HCL 5 MG/ML IJ SOLN
10.0000 mg | Freq: Once | INTRAMUSCULAR | Status: AC
  Administered 2024-01-27: 10 mg via INTRAVENOUS
  Filled 2024-01-27: qty 2

## 2024-01-27 MED ORDER — DIPHENHYDRAMINE HCL 50 MG/ML IJ SOLN
25.0000 mg | Freq: Once | INTRAMUSCULAR | Status: AC
  Administered 2024-01-27: 25 mg via INTRAVENOUS
  Filled 2024-01-27: qty 1

## 2024-01-27 NOTE — ED Provider Notes (Signed)
 [facility]-EMERGENCY DEPT [facility] Emergency Department Provider Note MRN:  [id number]  Arrival date & time: 01/27/24     Chief Complaint   Hypertension   History of Present Illness   Paul Jarvis is a 33 y.o. year-old male presents to the ED with chief complaint of back pain, shortness of breath, and headache.  He reports that his blood pressure has been high today.  States that he forgot to take his blood pressure medicine yesterday and this morning.  He states that his back pain does not feel like musculoskeletal back pain.  He denies any injury or trauma.  Denies any numbness, weakness, or tingling.  Denies any other associated symptoms.  History provided by patient.   Review of Systems  Pertinent positive and negative review of systems noted in HPI.    Physical Exam   Vitals:   01/27/24 0400 01/27/24 0412  BP: (!) 158/90   Pulse: 82   Resp: (!) 22   Temp:  97.8 F (36.6 C)  SpO2: 98%     CONSTITUTIONAL:  non toxic-appearing, NAD NEURO:  Alert and oriented x 3, CN 3-12 grossly intact EYES:  eyes equal and reactive ENT/NECK:  Supple, no stridor  CARDIO:  normal rate, regular rhythm, appears well-perfused, intact distal pulses bilaterally PULM:  No respiratory distress, CTAB GI/GU:  non-distended, non focal abdominal tenderness MSK/SPINE:  No gross deformities, no edema, moves all extremities, normal strength and sensation of lower extremities SKIN:  no rash, atraumatic   *Additional and/or pertinent findings included in MDM below  Diagnostic and Interventional Summary    EKG Interpretation Date/Time:    Ventricular Rate:    PR Interval:    QRS Duration:    QT Interval:    QTC Calculation:   R Axis:      Text Interpretation:         Labs Reviewed  BASIC METABOLIC PANEL WITH GFR - Abnormal; Notable for the following components:      Result Value   Glucose, Bld 101 (*)    Creatinine, Ser 1.65 (*)    GFR, Estimated 55 (*)    All other  components within normal limits  CBC - Abnormal; Notable for the following components:   HCT 38.6 (*)    All other components within normal limits  URINALYSIS, ROUTINE W REFLEX MICROSCOPIC  TROPONIN I (HIGH SENSITIVITY)  TROPONIN I (HIGH SENSITIVITY)    CT Angio Chest/Abd/Pel for Dissection W and/or Wo Contrast  Final Result    DG Chest 2 View  Final Result      Medications  iohexol (OMNIPAQUE) 350 MG/ML injection 100 mL (100 mLs Intravenous Contrast Given 01/27/24 0223)  lactated ringers bolus 1,000 mL (1,000 mLs Intravenous New Bag/Given 01/27/24 0312)  ketorolac (TORADOL) 30 MG/ML injection 30 mg (30 mg Intravenous Given 01/27/24 0308)  metoCLOPramide (REGLAN) injection 10 mg (10 mg Intravenous Given 01/27/24 0309)  diphenhydrAMINE (BENADRYL) injection 25 mg (25 mg Intravenous Given 01/27/24 0308)     Procedures  /  Critical Care Procedures  ED Course and Medical Decision Making  I have reviewed the triage vital signs, the nursing notes, and pertinent available records from the EMR.  Social Determinants Affecting Complexity of Care: Patient has no clinically significant social determinants affecting this chief complaint..   ED Course:    Medical Decision Making Patient here with elevated BP and back pain.  He states that it doesn't feel like MSK back pain.  Given his significant hypertension, will check  CT.  Labs are reassuring.  Trops are flat at 11 and 12, doubt ACS.  Denies any chest pain or SOB.  Non-ischemic EKG.  Creatinine is elevated at 1.65, which is similar to where he has been recently.  Will have him follow-up with PCP.    No reported urinary complaints.  Doubt UTI.  Amount and/or Complexity of Data Reviewed Labs: ordered. Radiology: ordered. ECG/medicine tests: ordered.  Risk Prescription drug management.         Consultants: No consultations were needed in caring for this patient.   Treatment and Plan: I considered admission due to patient's  initial presentation, but after considering the examination and diagnostic results, patient will not require admission and can be discharged with outpatient follow-up.    Final Clinical Impressions(s) / ED Diagnoses     ICD-10-CM   1. Acute back pain, unspecified back location, unspecified back pain laterality  M54.9     2. Elevated blood pressure reading  R03.0       ED Discharge Orders     None         Discharge Instructions Discussed with and Provided to Patient:     Discharge Instructions      You tests looked good.  Your blood pressure improved.  Please continue taking your medications as prescribed.  Please follow-up with your regular doctor.       Sherel Dikes, PA-C 01/27/24 0424    Teddi Favors, DO 01/27/24 Alonso Jan

## 2024-01-27 NOTE — Discharge Instructions (Addendum)
 You tests looked good.  Your blood pressure improved.  Please continue taking your medications as prescribed.  Please follow-up with your regular doctor.

## 2024-02-16 ENCOUNTER — Other Ambulatory Visit: Payer: Self-pay | Admitting: Student

## 2024-02-16 DIAGNOSIS — I10 Essential (primary) hypertension: Secondary | ICD-10-CM

## 2024-02-17 ENCOUNTER — Encounter: Admitting: Student
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
## 2024-02-20 ENCOUNTER — Encounter: Admitting: Student

## 2024-02-27 NOTE — Telephone Encounter (Signed)
He needs to make a follow up appt

## 2024-03-04 ENCOUNTER — Ambulatory Visit (HOSPITAL_COMMUNITY)
Admission: RE | Admit: 2024-03-04 | Discharge: 2024-03-04 | Disposition: A | Discharge status: Home / Self Care | Source: Ambulatory Visit | Attending: Vascular Surgery | Admitting: Vascular Surgery

## 2024-03-04 DIAGNOSIS — I10 Essential (primary) hypertension: Secondary | ICD-10-CM

## 2024-03-23 ENCOUNTER — Encounter: Payer: Self-pay | Admitting: Student

## 2024-03-31 ENCOUNTER — Ambulatory Visit: Payer: Self-pay | Admitting: Student

## 2024-04-01 ENCOUNTER — Other Ambulatory Visit: Payer: Self-pay | Admitting: Student

## 2024-04-01 NOTE — Progress Notes (Signed)
 Insurance authorization to start Farxiga  10 mg daily for kidney function denied as patient does not have a formal diagnosis of CKD or T2DM. Has been demonstrating worsening kidney function over time on labs. Recent renal ultrasound showing normal size right and left kidneys with no evidence of right or left renal artery stenosis. Called patient to encourage him to make follow up appointment at [facility] for repeat labs, blood pressure check, and determine next steps in his care. Patient acknowledged understanding.   Margarita Croke Michelina Aho, MD PGY-1, [facility]

## 2024-05-06 ENCOUNTER — Other Ambulatory Visit: Payer: Self-pay | Admitting: Student

## 2024-05-06 DIAGNOSIS — I10 Essential (primary) hypertension: Secondary | ICD-10-CM

## 2024-05-06 NOTE — Telephone Encounter (Unsigned)
 Copied from CRM [id number]. Topic: Clinical - Medication Refill >> May 06, 2024  4:43 PM Fredrica W wrote: Medication: carvedilol  (COREG ) 6.25 MG tablet  Has the patient contacted their pharmacy? Yes (Agent: If no, request that the patient contact the pharmacy for the refill. If patient does not wish to contact the pharmacy document the reason why and proceed with request.) (Agent: If yes, when and what did the pharmacy advise?) no refills   This is the patient's preferred pharmacy: [facility]  [address] [place], [place] [address] Phone: [phone]  Is this the correct pharmacy for this prescription? Yes If no, delete pharmacy and type the correct one.   Has the prescription been filled recently? No  Is the patient out of the medication? Yes  Has the patient been seen for an appointment in the last year OR does the patient have an upcoming appointment? Yes  Can we respond through MyChart? Yes  Agent: Please be advised that Rx refills may take up to 3 business days. We ask that you follow-up with your pharmacy.

## 2024-05-07 MED ORDER — CARVEDILOL 6.25 MG PO TABS
6.2500 mg | ORAL_TABLET | Freq: Two times a day (BID) | ORAL | 1 refills | Status: DC
Start: 2024-05-07 — End: 2024-07-13

## 2024-05-07 NOTE — Telephone Encounter (Signed)
 Medication sent to pharmacy

## 2024-05-21 ENCOUNTER — Telehealth: Payer: Self-pay | Admitting: *Deleted

## 2024-05-21 NOTE — Telephone Encounter (Signed)
 Attempted to contact pt with an appt (overdue) Pt unable to speak and requested to call office back after lunch.   No further action needed. Pt will call back to schedule appt preferably with his pcp [name]8/7/202511:59 AM

## 2024-07-13 ENCOUNTER — Other Ambulatory Visit: Payer: Self-pay | Admitting: Student

## 2024-07-13 DIAGNOSIS — I10 Essential (primary) hypertension: Secondary | ICD-10-CM

## 2024-07-13 MED ORDER — CARVEDILOL 6.25 MG PO TABS: 6.2500 mg | ORAL_TABLET | Freq: Two times a day (BID) | ORAL | 0 refills | Status: DC

## 2024-07-13 NOTE — Telephone Encounter (Unsigned)
 Copied from CRM [id number]. Topic: Clinical - Medication Refill >> Jul 13, 2024  3:18 PM Paul Jarvis wrote: Medication: carvedilol  (COREG ) 6.25 MG tablet  Has the patient contacted their pharmacy? Yes (Agent: If no, request that the patient contact the pharmacy for the refill. If patient does not wish to contact the pharmacy document the reason why and proceed with request.) (Agent: If yes, when and what did the pharmacy advise?)  This is the patient's preferred pharmacy:  [facility] - [place], [place] - [address] [place] [address] Phone: [phone] Fax: [phone]  Is this the correct pharmacy for this prescription? Yes If no, delete pharmacy and type the correct one.   Has the prescription been filled recently? No  Is the patient out of the medication? Yes  Has the patient been seen for an appointment in the last year OR does the patient have an upcoming appointment? Yes  Can we respond through MyChart? Yes  Agent: Please be advised that Rx refills may take up to 3 business days. We ask that you follow-up with your pharmacy.

## 2024-07-14 ENCOUNTER — Other Ambulatory Visit: Payer: Self-pay | Admitting: Student

## 2024-07-14 MED ORDER — AMLODIPINE BESYLATE-VALSARTAN 10-320 MG PO TABS: 1.0000 | ORAL_TABLET | Freq: Every day | ORAL | 0 refills | Status: DC

## 2024-07-14 NOTE — Telephone Encounter (Signed)
 Copied from CRM #[id number]. Topic: Clinical - Medication Refill >> Jul 14, 2024  4:11 PM Vivian Z wrote: Medication: amLODipine -valsartan  (EXFORGE ) 10-320 MG tablet  Has the patient contacted their pharmacy? No (Agent: If no, request that the patient contact the pharmacy for the refill. If patient does not wish to contact the pharmacy document the reason why and proceed with request.) (Agent: If yes, when and what did the pharmacy advise?)  This is the patient's preferred pharmacy:  [facility] - [place], [place] - [address] [place] [address] Phone: [phone] Fax: [phone]  Is this the correct pharmacy for this prescription? Yes If no, delete pharmacy and type the correct one.   Has the prescription been filled recently? No  Is the patient out of the medication? No  Has the patient been seen for an appointment in the last year OR does the patient have an upcoming appointment? Yes  Can we respond through MyChart? Yes  Agent: Please be advised that Rx refills may take up to 3 business days. We ask that you follow-up with your pharmacy.

## 2024-08-06 ENCOUNTER — Ambulatory Visit: Admitting: Student

## 2024-08-06 VITALS — BP 201/130 | HR 86 | Ht 69.0 in | Wt 251.4 lb

## 2024-08-06 DIAGNOSIS — G4733 Obstructive sleep apnea (adult) (pediatric): Secondary | ICD-10-CM | POA: Diagnosis not present

## 2024-08-06 DIAGNOSIS — I129 Hypertensive chronic kidney disease with stage 1 through stage 4 chronic kidney disease, or unspecified chronic kidney disease: Secondary | ICD-10-CM

## 2024-08-06 DIAGNOSIS — Z79899 Other long term (current) drug therapy: Secondary | ICD-10-CM | POA: Diagnosis not present

## 2024-08-06 DIAGNOSIS — N1831 Chronic kidney disease, stage 3a: Secondary | ICD-10-CM

## 2024-08-06 DIAGNOSIS — Z8249 Family history of ischemic heart disease and other diseases of the circulatory system: Secondary | ICD-10-CM

## 2024-08-06 DIAGNOSIS — N289 Disorder of kidney and ureter, unspecified: Secondary | ICD-10-CM

## 2024-08-06 DIAGNOSIS — R809 Proteinuria, unspecified: Secondary | ICD-10-CM

## 2024-08-06 DIAGNOSIS — R82998 Other abnormal findings in urine: Secondary | ICD-10-CM | POA: Diagnosis not present

## 2024-08-06 DIAGNOSIS — I10 Essential (primary) hypertension: Secondary | ICD-10-CM

## 2024-08-06 DIAGNOSIS — F319 Bipolar disorder, unspecified: Secondary | ICD-10-CM

## 2024-08-06 MED ORDER — SPIRONOLACTONE 25 MG PO TABS: 25.0000 mg | ORAL_TABLET | Freq: Every day | ORAL | 1 refills | Status: DC

## 2024-08-06 NOTE — Assessment & Plan Note (Signed)
 Uncontrolled obstructive sleep apnea, nonadherent to CPAP therapy, likely contributing to poorly controlled blood pressure.  Plan: -Reinforced the importance of CPAP adherence as above.
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
## 2024-08-06 NOTE — Patient Instructions (Addendum)
 Your blood pressure was very high during today's visit. I have sent a prescription for spironolactone  to your pharmacy, as it appears this medication was not sent after your last visit.  Please continue taking amlodipine -valsartan  10/320 mg once daily and carvedilol  6.125 mg twice daily.  I have also ordered labs to check your kidney function and will call you once the results are available.  I have ordered the following labs for you:  Lab Orders         Urinalysis, Reflex Microscopic         Microalbumin / Creatinine Urine Ratio         Basic metabolic panel with GFR         Protein / Creatinine Ratio, Urine       Follow up: 2 weeks with Dr. Celestina    Should you have any questions or concerns please call the internal medicine clinic at [phone].     Missy Celestina, MD  [facility]

## 2024-08-06 NOTE — Assessment & Plan Note (Signed)
 Patient seen in the past by Integrated Behavioral Health at [facility]. Has been managing his bipolar depression with Carbamezapine 300 mg at bedtime. Also on Duloxetine  60 mg primarily for fibromyalgia. Last note from [facility] from 2023. Per patient, he follows up with Dr. Icantell, last seen for medication management in March and sees them about every 3 months. Feels like current medications are keeping his mood/anxiety under control. Feels stress from multiple jobs and family responsibilities. Has discussed adjusting regimen with Dr. Icantell in the past due to concerns that they are contributing to elevated BP, but patient would like to keep current regimen as it is.  Plan - Continue following up with psychiatry  Carbamezapine 300 mg at bedtime Continue Duloxetine  60 mg daily  - CBC with diff today, follow up as needed

## 2024-08-06 NOTE — Assessment & Plan Note (Addendum)
 Uncontrolled hypertension, likely multifactorial--contributing factors include poorly controlled obstructive sleep apnea and suboptimal medication management (spironolactone  prescription was not sent to the pharmacy). The patient reports medication compliance and took his morning antihypertensive dose shortly before today's visit.  Blood pressure remains elevated on exam. He denies chest pain or palpitations. Prior workup for secondary causes of hypertension, including hyperaldosteronism and renal artery stenosis, has been unremarkable. Plan - Carvedilol  to 6.25 mg BID with meals - Continue amlodipine -valsartan  10-320 mg daily  - Restart spironolactone  25 mg daily  - 2 weeks follow-up for blood pressure. - Emphasized compliance with CPAP machine.  Addendum - Hold off on spironolactone  and Farxiga  due to SCr elevation. - Will increase carvedilol  to 12.5 mg twice daily - Continue amlodipine -valsartan  10-320 mg daily. - Severely increased albuminuria on UACR, referred to nephrology. - Patient has follow-up appointment with me on 11/05, advised to bring all his home medicines. - If blood pressure is elevated at next office visit, we will add  back spironolactone .

## 2024-08-07 DIAGNOSIS — N1831 Chronic kidney disease, stage 3a: Secondary | ICD-10-CM | POA: Insufficient documentation

## 2024-08-07 LAB — BASIC METABOLIC PANEL WITH GFR
BUN/Creatinine Ratio: 11 (ref 9–20)
BUN: 21 mg/dL — ABNORMAL HIGH (ref 6–20)
CO2: 25 mmol/L (ref 20–29)
Calcium: 9.7 mg/dL (ref 8.7–10.2)
Chloride: 102 mmol/L (ref 96–106)
Creatinine, Ser: 1.86 mg/dL — ABNORMAL HIGH (ref 0.76–1.27)
Glucose: 95 mg/dL (ref 70–99)
Potassium: 3.7 mmol/L (ref 3.5–5.2)
Sodium: 141 mmol/L (ref 134–144)
eGFR: 48 mL/min/1.73 — ABNORMAL LOW (ref >59)

## 2024-08-07 LAB — MICROSCOPIC EXAMINATION
Bacteria, UA: NONE SEEN
Casts: NONE SEEN /LPF
Epithelial Cells (non renal): NONE SEEN /HPF (ref 0–10)
RBC, Urine: NONE SEEN /HPF (ref 0–2)
WBC, UA: NONE SEEN /HPF (ref 0–5)

## 2024-08-07 LAB — PROTEIN / CREATININE RATIO, URINE
Creatinine, Urine: 144.1 mg/dL
Protein, Ur: 215.5 mg/dL
Protein/Creat Ratio: 1495 mg/g{creat} — ABNORMAL HIGH (ref 0–200)

## 2024-08-07 LAB — URINALYSIS, ROUTINE W REFLEX MICROSCOPIC
Bilirubin, UA: NEGATIVE
Glucose, UA: NEGATIVE
Ketones, UA: NEGATIVE
Leukocytes,UA: NEGATIVE
Nitrite, UA: NEGATIVE
RBC, UA: NEGATIVE
Specific Gravity, UA: 1.018 (ref 1.005–1.030)
Urobilinogen, Ur: 0.2 mg/dL (ref 0.2–1.0)
pH, UA: 6 (ref 5.0–7.5)

## 2024-08-07 LAB — MICROALBUMIN / CREATININE URINE RATIO
Microalb/Creat Ratio: 882 mg/g{creat} — ABNORMAL HIGH (ref 0–29)
Microalbumin, Urine: 1271.2 ug/mL

## 2024-08-07 MED ORDER — DAPAGLIFLOZIN PROPANEDIOL 10 MG PO TABS: 10.0000 mg | ORAL_TABLET | Freq: Every day | ORAL | 3 refills | Status: AC

## 2024-08-07 NOTE — Assessment & Plan Note (Signed)
 Urinalysis three months ago showed 3+ proteinuria. The patient reports foamy urine but denies any family history of kidney disease. Echocardiogram from 2023 was normal, with no evidence of diastolic dysfunction, making infiltrative disease such as amyloidosis less likely. Plan; - Repeat urinalysis - Quantify protein with urine microalbumin/creatinine ratio and spot protein/creatinine ratio

## 2024-08-07 NOTE — Assessment & Plan Note (Signed)
 Chronic kidney disease, likely secondary to long-standing uncontrolled hypertension. The patient is an appropriate candidate for dapagliflozin  (Farxiga ) for renal and cardiovascular benefit; coverage was previously denied due to "no documented CKD," despite chart evidence.  Plan: -BMP -Repeat/obtain urine albumin-creatinine ratio and/or spot protein-creatinine ratio if not current. - Will order Farxiga 

## 2024-08-10 ENCOUNTER — Other Ambulatory Visit: Payer: Self-pay | Admitting: Student

## 2024-08-10 ENCOUNTER — Telehealth: Payer: Self-pay

## 2024-08-10 ENCOUNTER — Ambulatory Visit: Payer: Self-pay | Admitting: Student

## 2024-08-10 DIAGNOSIS — N1831 Chronic kidney disease, stage 3a: Secondary | ICD-10-CM

## 2024-08-10 DIAGNOSIS — I10 Essential (primary) hypertension: Secondary | ICD-10-CM

## 2024-08-10 MED ORDER — CARVEDILOL 12.5 MG PO TABS
12.5000 mg | ORAL_TABLET | Freq: Two times a day (BID) | ORAL | 6 refills | Status: DC
Start: 2024-08-10 — End: 2024-08-20

## 2024-08-10 NOTE — Progress Notes (Signed)
 Outgoing telephone call to discuss antihypertensive regimen adjustments in the setting of elevated serum creatinine. -Will hold spironolactone  and Farxiga  given elevated SCr. -Will increase carvedilol  to 12.5 mg twice daily. -Patient was advised to take morning blood pressure medications prior to his upcoming appointment and to bring all antihypertensive medications to the visit on 11/6.  Patient verbalized understanding of the plan and acknowledged the need to discard the old carvedilol  dose.

## 2024-08-10 NOTE — Telephone Encounter (Signed)
 Paul Jarvis (Key: [id number]) PA Case ID #: [id number] Rx #: [id number] Need Help? Call us  at [phone] Outcome Approved today by [facility] Medicaid 2017 Approved. This drug has been approved. Approved quantity: 30 tablets per 30 day(s). You may fill up to a 34 day supply at a retail pharmacy. You may fill up to a 90 day supply for maintenance drugs, please refer to the formulary for details. Please call the pharmacy to process your prescription claim. Effective Date: 08/10/2024 Authorization Expiration Date: 08/10/2025 Drug Dapagliflozin  Propanediol 10MG  tablets ePA cloud logo Form [employer] [place]  Electronic Prior Authorization Request Form (2017 NCPDP) Original Claim Info 75 Prior Authorization Required  Patient is aware of approval.

## 2024-08-10 NOTE — Progress Notes (Addendum)
 Chronic kidney disease stage IIIa with worsening renal function (Cr 1.65 ? 1.86 mg/dL) and significant proteinuria. UA notable for 3+ protein, negative for RBC casts. Likely secondary to uncontrolled hypertension. No current evidence of nephritic process or nephrotic range proteinuria.  Plan: -Increase carvedilol  to 12.5 mg twice daily for improved BP control. -Hold spironolactone  and Farxiga  for now due to acute rise in serum creatinine. -Follow-up in 2 weeks, if BP still elevated could consider adding the spironolactone , and reassess when to restart Farxiga . - Sent referral to nephrology.

## 2024-08-10 NOTE — Telephone Encounter (Signed)
 Prior Authorization for patient (Dapagliflozin  Propanediol 10MG  tablets) came through on cover my meds was submitted with last office notes awaiting approval or denial.  KEY:[id number]

## 2024-08-11 ENCOUNTER — Other Ambulatory Visit: Payer: Self-pay | Admitting: Student

## 2024-08-11 DIAGNOSIS — I10 Essential (primary) hypertension: Secondary | ICD-10-CM

## 2024-08-12 NOTE — Progress Notes (Signed)
 Internal Medicine Clinic Attending  Case discussed with the resident at the time of the visit.  We reviewed the resident's history and exam and pertinent patient test results.  I agree with the assessment, diagnosis, and plan of care documented in the resident's note. I worry about medication adherence and worsening renal function.  Says he took his bp meds just before arrival.  Historically on amlodipine  valsartan  and low dose carvedilol  usually runs 150's-160's systolic.  Will increase carvedilol  to 12.5 today and refer to nephrology and have our clinical pharmacist work with him as well.  Still not using cpap.  Needs close follow up.

## 2024-08-20 ENCOUNTER — Encounter: Payer: Self-pay | Admitting: Student

## 2024-08-20 ENCOUNTER — Ambulatory Visit: Admitting: Student

## 2024-08-20 VITALS — BP 174/70 | HR 75 | Temp 98.0°F | Ht 69.0 in | Wt 254.2 lb

## 2024-08-20 DIAGNOSIS — I10 Essential (primary) hypertension: Secondary | ICD-10-CM

## 2024-08-20 DIAGNOSIS — N1831 Chronic kidney disease, stage 3a: Secondary | ICD-10-CM | POA: Diagnosis not present

## 2024-08-20 DIAGNOSIS — G4733 Obstructive sleep apnea (adult) (pediatric): Secondary | ICD-10-CM

## 2024-08-20 DIAGNOSIS — M79602 Pain in left arm: Secondary | ICD-10-CM

## 2024-08-20 MED ORDER — TIRZEPATIDE-WEIGHT MANAGEMENT 2.5 MG/0.5ML ~~LOC~~ SOLN
2.5000 mg | SUBCUTANEOUS | 0 refills | Status: AC
Start: 2024-08-20 — End: ?

## 2024-08-20 MED ORDER — CARVEDILOL 25 MG PO TABS: 25.0000 mg | ORAL_TABLET | Freq: Two times a day (BID) | ORAL | Status: DC

## 2024-08-20 NOTE — Patient Instructions (Addendum)
 It was a pleasure taking care of you today!    Your blood pressure is mildly improved but remains elevated. I will increase your carvedilol  to 25 mg twice daily. Continue amlodipine -valsartan  10-320 mg daily.  I have added a new medication called Farxiga  (dapagliflozin ) 10 mg once daily.  Please start tirzepatide 2.5 mg weekly injection. If you tolerate it well, we will increase the dose to 5 mg weekly at your next follow-up.  A referral to physical therapy has been placed for your neck pain.  Please continue taking all your prescribed medications as directed.  I have ordered the following labs for you:  Lab Orders         Basic metabolic panel with GFR        Follow up: 2 weeks for blood pressure check.    Should you have any questions or concerns please call the internal medicine clinic at [phone].     Missy Sandhoff, MD  [facility]

## 2024-08-20 NOTE — Progress Notes (Signed)
 Internal Medicine Clinic Attending  Case discussed with the resident at the time of the visit.  We reviewed the resident's history and exam and pertinent patient test results.  I agree with the assessment, diagnosis, and plan of care documented in the resident's note.
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
## 2024-08-20 NOTE — Assessment & Plan Note (Signed)
 History of obstructive sleep apnea (OSA) on CPAP and obesity. Last sleep study completed in 2023. Given OSA and obesity, patient would benefit from GLP-1 agonist therapy for weight reduction and metabolic improvement. Patient is agreeable to initiation. - Start tirzepatide 2.5 mg subcutaneously once weekly. - Plan to titrate dose every 4 weeks as tolerated. - Continue CPAP therapy nightly. - Reinforce diet, exercise, and weight management strategies.

## 2024-08-20 NOTE — Assessment & Plan Note (Signed)
 BP Readings from Last 3 Encounters:  08/20/24 (!) 174/70  08/06/24 (!) 201/130  01/27/24 (!) 158/90  Uncontrolled hypertension, endorses compliance for the last 2 weeks.  Heart rate stable. His OP regimen includes carvedilol  carvedilol  12.5 mg BID and amlodipine -valsartan  10-320.   Previously tried thiazide but discontinued due to severe hypokalemia.  - Increase carvedilol  to 25 mg twice daily. - Start Farxiga  10 mg daily. - Continue amlodipine -valsartan  10/320 mg.  -Will check BMP today. - Follow-up in 2 weeks, could consider adding spironolactone  if BP still elevated

## 2024-08-20 NOTE — Assessment & Plan Note (Signed)
 From longstanding uncontrolled hypertension. -Adding Farxiga  as above. -Waiting for appointment for nephrology.

## 2024-08-20 NOTE — Assessment & Plan Note (Signed)
 Chronic, benefited from PT in the past.   -Referred to physical therapy.

## 2024-08-21 LAB — BASIC METABOLIC PANEL WITH GFR
BUN/Creatinine Ratio: 10 (ref 9–20)
BUN: 19 mg/dL (ref 6–20)
CO2: 24 mmol/L (ref 20–29)
Calcium: 9.4 mg/dL (ref 8.7–10.2)
Chloride: 103 mmol/L (ref 96–106)
Creatinine, Ser: 1.84 mg/dL — ABNORMAL HIGH (ref 0.76–1.27)
Glucose: 91 mg/dL (ref 70–99)
Potassium: 3.9 mmol/L (ref 3.5–5.2)
Sodium: 139 mmol/L (ref 134–144)
eGFR: 48 mL/min/1.73 — ABNORMAL LOW (ref >59)

## 2024-08-22 ENCOUNTER — Other Ambulatory Visit: Payer: Self-pay | Admitting: Student

## 2024-08-22 ENCOUNTER — Ambulatory Visit: Payer: Self-pay | Admitting: Student

## 2024-08-22 MED ORDER — CARVEDILOL 25 MG PO TABS: 25.0000 mg | ORAL_TABLET | Freq: Two times a day (BID) | ORAL | 3 refills | Status: DC

## 2024-08-22 NOTE — Progress Notes (Signed)
 BMP with stable electrolyte, SCr 1.84 on CKD stage 3A( baseline Scr 1.40-1.65)  Patient notified via a call.

## 2024-09-11 ENCOUNTER — Other Ambulatory Visit: Payer: Self-pay | Admitting: Student

## 2024-09-14 NOTE — Telephone Encounter (Signed)
 Medication sent to pharmacy

## 2024-11-17 ENCOUNTER — Other Ambulatory Visit: Payer: Self-pay | Admitting: Student

## 2024-11-18 NOTE — Telephone Encounter (Signed)
 Medication sent to pharmacy
